# Patient Record
Sex: Female | Born: 1970 | Race: Black or African American | Hispanic: No | Marital: Married | Smoking: Never smoker
Health system: Southern US, Community
[De-identification: ages and names within clinical notes are randomized; demographics above are authoritative.]

## PROBLEM LIST (undated history)

## (undated) DIAGNOSIS — Z973 Presence of spectacles and contact lenses: Secondary | ICD-10-CM

## (undated) DIAGNOSIS — D259 Leiomyoma of uterus, unspecified: Secondary | ICD-10-CM

## (undated) DIAGNOSIS — D509 Iron deficiency anemia, unspecified: Secondary | ICD-10-CM

## (undated) DIAGNOSIS — N921 Excessive and frequent menstruation with irregular cycle: Secondary | ICD-10-CM

## (undated) DIAGNOSIS — R9389 Abnormal findings on diagnostic imaging of other specified body structures: Secondary | ICD-10-CM

---

## 1998-10-10 ENCOUNTER — Ambulatory Visit (HOSPITAL_COMMUNITY): Admission: RE | Admit: 1998-10-10 | Discharge: 1998-10-10 | Payer: Self-pay | Admitting: Obstetrics and Gynecology

## 1998-12-26 ENCOUNTER — Inpatient Hospital Stay (HOSPITAL_COMMUNITY): Admission: AD | Admit: 1998-12-26 | Discharge: 1998-12-26 | Payer: Self-pay | Admitting: Obstetrics and Gynecology

## 1998-12-28 ENCOUNTER — Inpatient Hospital Stay (HOSPITAL_COMMUNITY): Admission: AD | Admit: 1998-12-28 | Discharge: 1998-12-30 | Payer: Self-pay | Admitting: Obstetrics and Gynecology

## 2000-06-19 ENCOUNTER — Other Ambulatory Visit: Admission: RE | Admit: 2000-06-19 | Discharge: 2000-06-19 | Payer: Self-pay | Admitting: Obstetrics and Gynecology

## 2001-03-07 ENCOUNTER — Other Ambulatory Visit: Admission: RE | Admit: 2001-03-07 | Discharge: 2001-03-07 | Payer: Self-pay | Admitting: Obstetrics and Gynecology

## 2001-03-28 ENCOUNTER — Encounter: Payer: Self-pay | Admitting: Obstetrics and Gynecology

## 2001-03-28 ENCOUNTER — Ambulatory Visit (HOSPITAL_COMMUNITY): Admission: RE | Admit: 2001-03-28 | Discharge: 2001-03-28 | Payer: Self-pay | Admitting: Obstetrics and Gynecology

## 2001-08-13 HISTORY — PX: TUBAL LIGATION: SHX77

## 2001-08-15 ENCOUNTER — Inpatient Hospital Stay (HOSPITAL_COMMUNITY): Admission: AD | Admit: 2001-08-15 | Discharge: 2001-08-15 | Payer: Self-pay | Admitting: Obstetrics and Gynecology

## 2001-08-27 ENCOUNTER — Encounter (INDEPENDENT_AMBULATORY_CARE_PROVIDER_SITE_OTHER): Payer: Self-pay

## 2001-08-27 ENCOUNTER — Inpatient Hospital Stay (HOSPITAL_COMMUNITY): Admission: AD | Admit: 2001-08-27 | Discharge: 2001-08-30 | Payer: Self-pay | Admitting: Obstetrics and Gynecology

## 2001-08-29 HISTORY — PX: TUBAL LIGATION: SHX77

## 2008-04-26 ENCOUNTER — Other Ambulatory Visit: Admission: RE | Admit: 2008-04-26 | Discharge: 2008-04-26 | Payer: Self-pay | Admitting: Family Medicine

## 2009-06-30 ENCOUNTER — Other Ambulatory Visit: Admission: RE | Admit: 2009-06-30 | Discharge: 2009-06-30 | Payer: Self-pay | Admitting: Family Medicine

## 2010-12-05 ENCOUNTER — Other Ambulatory Visit: Payer: Self-pay

## 2010-12-05 ENCOUNTER — Other Ambulatory Visit (HOSPITAL_COMMUNITY)
Admission: RE | Admit: 2010-12-05 | Discharge: 2010-12-05 | Disposition: A | Discharge status: Home / Self Care | Payer: BC Managed Care – PPO | Source: Ambulatory Visit | Attending: Internal Medicine | Admitting: Internal Medicine

## 2010-12-05 DIAGNOSIS — Z01419 Encounter for gynecological examination (general) (routine) without abnormal findings: Secondary | ICD-10-CM | POA: Insufficient documentation
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
## 2010-12-29 NOTE — Op Note (Signed)
[facility]  Patient:    Regina Chung, Regina Chung Visit Number: [id number] MRN: [id number]          Service Type: OBS Location: 910A 9135 01 Attending Physician:  Michaele Offer Dictated by:   Malachi Pro. Ambrose Mantle, M.D. Proc. Date: 08/29/01 Admit Date:  08/27/2001                             Operative Report  PREOPERATIVE DIAGNOSIS:       Voluntary sterilization.  POSTOPERATIVE DIAGNOSIS:      Voluntary sterilization.  OPERATION:                    Bilateral tubal ligation.  SURGEON:                      Malachi Pro. Ambrose Mantle, M.D.  ANESTHESIA:                   Epidural.  DESCRIPTION OF PROCEDURE:     The patient was brought to the operating room and placed under satisfactory epidural anesthesia.  We had to wait a significant period of time for the anesthetic to become effective but it finally did.  I also used a little local anesthetic in the skin to try to anesthetize the skin.  A subumbilical semilunar incision was made and carried in layers through the skin and subcutaneous tissue.  I wanted to be very careful entering the peritoneal cavity because you could see bowel loops just under the incision.  I did enter the peritoneal cavity with great care, exposed both tubes, traced them to their fimbriated ends - I did not get a real good look at either ovary - elevated the tube into the operative field on both the right and left sides, cauterized a portion of the mesosalpinx, placed two ties proximally and distally on the right and left tubes, excised a portion of tube in-between the ties.  The right mesosalpinx was completely hemostatic.  The left, however, had a brisk bleeder.  I cauterized this and then I sutured the entire mesosalpinx with a running suture of 3-0 Vicryl.  It did not seem to tie down real well so I placed an interrupted figure-of-eight suture of 3-0 Vicryl through the mesosalpinx.  There did not seem to be any bleeding at all at  this point.  I had checked the removed segments of tube to confirm that they did contain the tube and they did.  At this point hemostasis was adequate.  I did inspect the lower part of the uterus and found a dense adhesion to the lower part of the uterus where I suspect the C section was done, but through my small semilunar incision I could not get to it to dissect away the adhesion, so it is just a matter of note that there is a dense adhesion over the anterior part of the lower uterine segment.  The fascia was then closed with a running suture of 0 Vicryl incorporating the peritoneum and the fascia and some of the subcutaneous tissue into the suture, and then the skin was closed with interrupted sutures of 3-0 plain catgut.  The patient seemed to tolerated the procedure well.  Blood loss was no more than 10 cc. Sponge and needle counts were correct and she was returned to the recovery in satisfactory condition. Dictated by:   Malachi Pro. Ambrose Mantle, M.D. Attending Physician:  Meisinger, Garlan Fair DD:  08/29/01 TD:  08/29/01 Job: [id number]
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
## 2013-02-09 ENCOUNTER — Encounter (HOSPITAL_COMMUNITY): Payer: Self-pay | Admitting: *Deleted

## 2013-02-09 ENCOUNTER — Emergency Department (HOSPITAL_COMMUNITY)
Admission: EM | Admit: 2013-02-09 | Discharge: 2013-02-09 | Disposition: A | Discharge status: Home / Self Care | Payer: BC Managed Care – PPO | Source: Home / Self Care | Attending: Emergency Medicine | Admitting: Emergency Medicine

## 2013-02-09 ENCOUNTER — Emergency Department (INDEPENDENT_AMBULATORY_CARE_PROVIDER_SITE_OTHER): Payer: BC Managed Care – PPO

## 2013-02-09 DIAGNOSIS — R109 Unspecified abdominal pain: Secondary | ICD-10-CM

## 2013-02-09 LAB — POCT URINALYSIS DIP (DEVICE)
Bilirubin Urine: NEGATIVE
Glucose, UA: NEGATIVE mg/dL
Hgb urine dipstick: NEGATIVE
Ketones, ur: NEGATIVE mg/dL
Leukocytes, UA: NEGATIVE
Nitrite: NEGATIVE
Protein, ur: NEGATIVE mg/dL
Specific Gravity, Urine: 1.015 (ref 1.005–1.030)
Urobilinogen, UA: 2 mg/dL — ABNORMAL HIGH (ref 0.0–1.0)
pH: 7 (ref 5.0–8.0)

## 2013-02-09 LAB — POCT I-STAT, CHEM 8
BUN: 7 mg/dL (ref 6–23)
Calcium, Ion: 1.2 mmol/L (ref 1.12–1.23)
Chloride: 102 mEq/L (ref 96–112)
Glucose, Bld: 72 mg/dL (ref 70–99)
HCT: 40 % (ref 36.0–46.0)
TCO2: 25 mmol/L (ref 0–100)

## 2013-02-09 MED ORDER — POLYETHYLENE GLYCOL 3350 17 GM/SCOOP PO POWD: 17.0000 g | Freq: Two times a day (BID) | ORAL | Status: DC

## 2013-02-09 MED ORDER — NAPROXEN 500 MG PO TABS: 500.0000 mg | ORAL_TABLET | Freq: Two times a day (BID) | ORAL | Status: DC

## 2013-02-09 MED ORDER — METHYLPREDNISOLONE 4 MG PO KIT: PACK | ORAL | Status: DC

## 2013-02-09 NOTE — ED Notes (Addendum)
C/o L flank pain onset 0900.  No burning or frequency of urination,  No hx. of kidney stones,  No chills or fever.  She tried to get from a lying position at 1500 and could not get up. Pain was 9/10 but is 4/10 now.  It is sharp and then dull.

## 2013-02-09 NOTE — ED Provider Notes (Signed)
Medical screening examination/treatment/procedure(s) were performed by non-physician practitioner and as supervising physician I was immediately available for consultation/collaboration.  Mishael Krysiak, M.D.  Aditya Nastasi C Latima Hamza, MD 02/09/13 2200 

## 2013-02-09 NOTE — ED Provider Notes (Signed)
History    CSN: [id number] Arrival date & time 02/09/13  1621  None    Chief Complaint  Patient presents with  . Flank Pain   (Consider location/radiation/quality/duration/timing/severity/associated sxs/prior Treatment) HPI Comments: 42 year old female presents complaining of left flank pain that began this morning around 9:00. The pain had a gradual onset and was grabbed initially as a dull, aching pain. Around knee and she went home and lay down for a nap, and when she tried to get up the pain was so severe that she could not sit up. It was very sharp and radiated around the flank towards the abdomen. This was associated with dizziness at the time but the dizziness has since resolved. Now, she is experiencing intermittent sharp, 9/10 pain and dull aching 4/10 pain. She describes the double pain as feeling like a large gas bubble. She denies any nausea, vomiting, hematuria, dysuria, chest pain, rash, fever, chills. She has never experienced anything like this before. She has not taken any over-the-counter or prescription medicines for the pain.  Patient is a 42 y.o. female presenting with flank pain.  Flank Pain Pertinent negatives include no chest pain, no abdominal pain and no shortness of breath.   History reviewed. No pertinent past medical history. Past Surgical History  Procedure Laterality Date  . Tubal ligation  2003   Family History  Problem Relation Age of Onset  . HIV Father    History  Substance Use Topics  . Smoking status: Never Smoker   . Smokeless tobacco: Not on file  . Alcohol Use: No   OB History   Grav Para Term Preterm Abortions TAB SAB Ect Mult Living                 Review of Systems  Constitutional: Negative for fever, chills and diaphoresis.  Eyes: Negative for visual disturbance.  Respiratory: Negative for cough and shortness of breath.   Cardiovascular: Negative for chest pain, palpitations and leg swelling.  Gastrointestinal: Negative for  nausea, vomiting, abdominal pain, diarrhea and constipation.  Endocrine: Negative for polydipsia and polyuria.  Genitourinary: Positive for flank pain. Negative for dysuria, urgency, frequency, hematuria, decreased urine volume, vaginal discharge and pelvic pain.  Musculoskeletal: Negative for myalgias and arthralgias.  Skin: Negative for rash.  Neurological: Positive for dizziness. Negative for weakness and light-headedness.    Allergies  Review of patient's allergies indicates no known allergies.  Home Medications   Current Outpatient Rx  Name  Route  Sig  Dispense  Refill  . methylPREDNISolone (MEDROL DOSEPAK) 4 MG tablet      Use taper dose pack as directed   21 tablet   0   . naproxen (NAPROSYN) 500 MG tablet   Oral   Take 1 tablet (500 mg total) by mouth 2 (two) times daily.   60 tablet   0   . polyethylene glycol powder (MIRALAX) powder   Oral   Take 17 g by mouth 2 (two) times daily.   255 g   0    BP 112/76  Pulse 64  Temp(Src) 99.7 F (37.6 C) (Oral)  Resp 18  SpO2 100%  LMP 01/17/2013 Physical Exam  Nursing note and vitals reviewed. Constitutional: She is oriented to person, place, and time. Vital signs are normal. She appears well-developed and well-nourished. No distress.  HENT:  Head: Atraumatic.  Eyes: EOM are normal. Pupils are equal, round, and reactive to light.  Cardiovascular: Normal rate, regular rhythm and normal heart sounds.  Exam  reveals no gallop and no friction rub.   No murmur heard. Pulmonary/Chest: Effort normal and breath sounds normal. No respiratory distress. She has no wheezes. She has no rales.  Abdominal: Soft. There is no tenderness. There is CVA tenderness (left sided).  Neurological: She is alert and oriented to person, place, and time. She has normal strength.  Skin: Skin is warm and dry. She is not diaphoretic.  Psychiatric: She has a normal mood and affect. Her behavior is normal. Judgment normal.    ED Course   Procedures (including critical care time) Labs Reviewed  POCT URINALYSIS DIP (DEVICE) - Abnormal; Notable for the following:    Urobilinogen, UA 2.0 (*)    All other components within normal limits  URINE CULTURE  POCT I-STAT, CHEM 8   Dg Abd 1 View  02/09/2013   *RADIOLOGY REPORT*  Clinical Data: Left flank pain.  ABDOMEN - 1 VIEW  Comparison: None.  Findings: No plain film evidence of urinary tract stone is identified.  Moderately large stool burden is noted.  Bowel gas pattern is otherwise unremarkable.  No focal bony abnormality.  IMPRESSION: Moderately large stool burden.  Otherwise negative.   Original Report Authenticated By: Holley Dexter, M.D.   1. Flank pain     MDM  This patient's history sounds like this could be nephrolithiasis. However there is no blood in the urine and no radiographic evidence of kidney stone. We'll treat her with anti-inflammatories, corticosteroids, and have her strain her urine to check for passing of a kidney stone. Also starting on twice a day MiraLax given in the remarks made by the radiologist about the moderately large stool burden. If she is still having this pain in 2-3 days she will return to the emergency department for further workup.   Meds ordered this encounter  Medications  . polyethylene glycol powder (MIRALAX) powder    Sig: Take 17 g by mouth 2 (two) times daily.    Dispense:  255 g    Refill:  0  . naproxen (NAPROSYN) 500 MG tablet    Sig: Take 1 tablet (500 mg total) by mouth 2 (two) times daily.    Dispense:  60 tablet    Refill:  0  . methylPREDNISolone (MEDROL DOSEPAK) 4 MG tablet    Sig: Use taper dose pack as directed    Dispense:  21 tablet    Refill:  0     Graylon Good, PA-C 02/09/13 2128

## 2013-02-12 LAB — URINE CULTURE

## 2013-03-27 ENCOUNTER — Other Ambulatory Visit: Payer: Self-pay | Admitting: Family Medicine

## 2013-03-27 ENCOUNTER — Other Ambulatory Visit (HOSPITAL_COMMUNITY)
Admission: RE | Admit: 2013-03-27 | Discharge: 2013-03-27 | Disposition: A | Discharge status: Home / Self Care | Payer: BC Managed Care – PPO | Source: Ambulatory Visit | Attending: Family Medicine | Admitting: Family Medicine

## 2013-03-27 DIAGNOSIS — Z0000 Encounter for general adult medical examination without abnormal findings: Secondary | ICD-10-CM | POA: Insufficient documentation

## 2014-04-05 ENCOUNTER — Other Ambulatory Visit: Payer: Self-pay | Admitting: Family Medicine

## 2014-04-05 ENCOUNTER — Other Ambulatory Visit: Payer: Self-pay

## 2014-04-05 DIAGNOSIS — Z1231 Encounter for screening mammogram for malignant neoplasm of breast: Secondary | ICD-10-CM

## 2014-11-02 ENCOUNTER — Ambulatory Visit
Admission: RE | Admit: 2014-11-02 | Discharge: 2014-11-02 | Disposition: A | Discharge status: Home / Self Care | Payer: 59 | Source: Ambulatory Visit | Attending: Family Medicine | Admitting: Family Medicine

## 2014-11-02 DIAGNOSIS — Z1231 Encounter for screening mammogram for malignant neoplasm of breast: Secondary | ICD-10-CM

## 2014-11-04 ENCOUNTER — Other Ambulatory Visit: Payer: Self-pay | Admitting: Family Medicine

## 2014-11-04 DIAGNOSIS — R928 Other abnormal and inconclusive findings on diagnostic imaging of breast: Secondary | ICD-10-CM

## 2014-11-11 ENCOUNTER — Other Ambulatory Visit: Payer: 59

## 2014-11-12 ENCOUNTER — Ambulatory Visit
Admission: RE | Admit: 2014-11-12 | Discharge: 2014-11-12 | Disposition: A | Discharge status: Home / Self Care | Payer: 59 | Source: Ambulatory Visit | Attending: Family Medicine | Admitting: Family Medicine

## 2014-11-12 DIAGNOSIS — R928 Other abnormal and inconclusive findings on diagnostic imaging of breast: Secondary | ICD-10-CM

## 2016-01-20 ENCOUNTER — Other Ambulatory Visit (HOSPITAL_COMMUNITY)
Admission: RE | Admit: 2016-01-20 | Discharge: 2016-01-20 | Disposition: A | Discharge status: Home / Self Care | Payer: Self-pay | Source: Ambulatory Visit | Attending: Family Medicine | Admitting: Family Medicine

## 2016-01-20 ENCOUNTER — Other Ambulatory Visit: Payer: Self-pay | Admitting: Physician Assistant

## 2016-01-20 DIAGNOSIS — Z124 Encounter for screening for malignant neoplasm of cervix: Secondary | ICD-10-CM | POA: Insufficient documentation

## 2016-01-25 LAB — CYTOLOGY - PAP

## 2016-10-26 ENCOUNTER — Other Ambulatory Visit: Payer: Self-pay | Admitting: Plastic Surgery

## 2017-01-23 ENCOUNTER — Other Ambulatory Visit: Payer: Self-pay | Admitting: Physician Assistant

## 2017-01-23 ENCOUNTER — Other Ambulatory Visit (HOSPITAL_COMMUNITY)
Admission: RE | Admit: 2017-01-23 | Discharge: 2017-01-23 | Disposition: A | Discharge status: Home / Self Care | Payer: BLUE CROSS/BLUE SHIELD | Source: Ambulatory Visit | Attending: Family Medicine | Admitting: Family Medicine

## 2017-01-23 DIAGNOSIS — Z124 Encounter for screening for malignant neoplasm of cervix: Secondary | ICD-10-CM | POA: Insufficient documentation

## 2017-01-28 LAB — CYTOLOGY - PAP: DIAGNOSIS: NEGATIVE

## 2018-08-13 HISTORY — PX: REDUCTION MAMMAPLASTY: SUR839

## 2019-05-26 ENCOUNTER — Other Ambulatory Visit: Payer: Self-pay | Admitting: Physician Assistant

## 2019-05-26 DIAGNOSIS — Z1231 Encounter for screening mammogram for malignant neoplasm of breast: Secondary | ICD-10-CM

## 2019-06-05 ENCOUNTER — Other Ambulatory Visit: Payer: Self-pay

## 2019-06-05 ENCOUNTER — Ambulatory Visit
Admission: RE | Admit: 2019-06-05 | Discharge: 2019-06-05 | Disposition: A | Discharge status: Home / Self Care | Payer: BLUE CROSS/BLUE SHIELD | Source: Ambulatory Visit | Attending: Physician Assistant | Admitting: Physician Assistant

## 2019-06-05 DIAGNOSIS — Z1231 Encounter for screening mammogram for malignant neoplasm of breast: Secondary | ICD-10-CM

## 2019-07-08 ENCOUNTER — Ambulatory Visit: Payer: BLUE CROSS/BLUE SHIELD

## 2019-07-20 HISTORY — PX: REDUCTION MAMMAPLASTY: SUR839

## 2020-04-06 ENCOUNTER — Ambulatory Visit
Admission: EM | Admit: 2020-04-06 | Discharge: 2020-04-06 | Disposition: A | Discharge status: Home / Self Care | Payer: 59 | Attending: Physician Assistant | Admitting: Physician Assistant

## 2020-04-06 ENCOUNTER — Other Ambulatory Visit: Payer: Self-pay

## 2020-04-06 DIAGNOSIS — H00015 Hordeolum externum left lower eyelid: Secondary | ICD-10-CM

## 2020-04-06 MED ORDER — ERYTHROMYCIN 5 MG/GM OP OINT: TOPICAL_OINTMENT | OPHTHALMIC | 0 refills | Status: DC

## 2020-04-06 NOTE — ED Provider Notes (Signed)
[facility]    CSN: [id number] Arrival date & time: 04/06/20  1656      History   Chief Complaint Chief Complaint  Patient presents with  . Eye Problem    HPI Regina Chung is a 49 y.o. female.   49 year old female comes in for 3 day history of left eyelid swelling. Left lower eyelid swelling that has gotten worse. Pain to the nasal aspect with some redness. Denies drainage/crusting, vision changes, photophobia. Denies URI symptoms, fever. Glasses use without contact lens use. Did have eyelash extension to bilateral upper eyelash, removed yesterday without improvement of symptoms. Warm compress without improvement of symptoms.      History reviewed. No pertinent past medical history.  There are no problems to display for this patient.   Past Surgical History:  Procedure Laterality Date  . TUBAL LIGATION  2003    OB History   No obstetric history on file.      Home Medications    Prior to Admission medications   Medication Sig Start Date End Date Taking? Authorizing Provider  erythromycin ophthalmic ointment Place a 1/2 inch ribbon of ointment 4 times a day to left lower eyelid x 7 days 04/06/20   Belinda Fisher, PA-C  naproxen (NAPROSYN) 500 MG tablet Take 1 tablet (500 mg total) by mouth 2 (two) times daily. 02/09/13   Graylon Good, PA-C    Family History Family History  Problem Relation Age of Onset  . HIV Father     Social History Social History   Tobacco Use  . Smoking status: Never Smoker  . Smokeless tobacco: Never Used  Vaping Use  . Vaping Use: Never used  Substance Use Topics  . Alcohol use: No  . Drug use: No     Allergies   Patient has no known allergies.   Review of Systems Review of Systems  Reason unable to perform ROS: See HPI as above.     Physical Exam Triage Vital Signs ED Triage Vitals  Enc Vitals Group     BP 04/06/20 1838 131/89     Pulse Rate 04/06/20 1838 60     Resp 04/06/20 1838 18     Temp  04/06/20 1838 98.5 F (36.9 C)     Temp Source 04/06/20 1838 Oral     SpO2 04/06/20 1838 100 %     Weight --      Height --      Head Circumference --      Peak Flow --      Pain Score 04/06/20 1836 2     Pain Loc --      Pain Edu? --      Excl. in GC? --    No data found.  Updated Vital Signs BP 131/89 (BP Location: Left Arm)   Pulse 60   Temp 98.5 F (36.9 C) (Oral)   Resp 18   LMP 04/06/2020   SpO2 100%   Physical Exam Constitutional:      General: She is not in acute distress.    Appearance: She is well-developed. She is not ill-appearing, toxic-appearing or diaphoretic.  HENT:     Head: Normocephalic and atraumatic.  Eyes:     Pupils: Pupils are equal, round, and reactive to light.     Comments: Left eye hordeolum to the nasal aspect of lower eyelid. Left lower eyelid swelling with mild erythema. No warmth. Conjunctiva clear. EOMI.   Neurological:  Mental Status: She is alert and oriented to person, place, and time.      UC Treatments / Results  Labs (all labs ordered are listed, but only abnormal results are displayed) Labs Reviewed - No data to display  EKG   Radiology No results found.  Procedures Procedures (including critical care time)  Medications Ordered in UC Medications - No data to display  Initial Impression / Assessment and Plan / UC Course  I have reviewed the triage vital signs and the nursing notes.  Pertinent labs & imaging results that were available during my care of the patient were reviewed by me and considered in my medical decision making (see chart for details).    Erythromycin ointment as directed. Lid scrubs and warm compresses as directed. Patient to follow up with ophthalmology if symptoms worsens or does not improve. Return precautions given.   Final Clinical Impressions(s) / UC Diagnoses   Final diagnoses:  Hordeolum externum of left lower eyelid   ED Prescriptions    Medication Sig Dispense Auth. Provider    erythromycin ophthalmic ointment Place a 1/2 inch ribbon of ointment 4 times a day to left lower eyelid x 7 days 1 g Belinda Fisher, PA-C     PDMP not reviewed this encounter.   Belinda Fisher, PA-C 04/06/20 1907

## 2020-04-06 NOTE — Discharge Instructions (Signed)
Erythromycin ointment as directed. Lid scrubs and warm compresses as directed. Monitor for any worsening of symptoms, changes in vision, sensitivity to light, eye swelling, painful eye movement, follow up with ophthalmology for further evaluation.   

## 2020-04-06 NOTE — ED Triage Notes (Signed)
Pt c/o left eye swelling since Sunday with increased swelling the past few days. Denies drainage/exudate from eye. Has been applying warm compresses and washed with OTC "pink eye wash". Denies fever, chills, congestion, runny nose or other c/o.

## 2021-06-08 ENCOUNTER — Other Ambulatory Visit: Payer: Self-pay | Admitting: Physician Assistant

## 2021-06-08 DIAGNOSIS — Z1231 Encounter for screening mammogram for malignant neoplasm of breast: Secondary | ICD-10-CM

## 2021-06-13 ENCOUNTER — Ambulatory Visit
Admission: RE | Admit: 2021-06-13 | Discharge: 2021-06-13 | Disposition: A | Discharge status: Home / Self Care | Payer: 59 | Source: Ambulatory Visit | Attending: Physician Assistant | Admitting: Physician Assistant

## 2021-06-13 ENCOUNTER — Other Ambulatory Visit: Payer: Self-pay

## 2021-06-13 DIAGNOSIS — Z1231 Encounter for screening mammogram for malignant neoplasm of breast: Secondary | ICD-10-CM

## 2021-12-11 HISTORY — PX: COLONOSCOPY: SHX174

## 2022-07-26 ENCOUNTER — Other Ambulatory Visit: Payer: Self-pay | Admitting: Physician Assistant

## 2022-07-26 DIAGNOSIS — Z1231 Encounter for screening mammogram for malignant neoplasm of breast: Secondary | ICD-10-CM

## 2022-09-17 ENCOUNTER — Ambulatory Visit
Admission: RE | Admit: 2022-09-17 | Discharge: 2022-09-17 | Disposition: A | Discharge status: Home / Self Care | Payer: Managed Care, Other (non HMO) | Source: Ambulatory Visit | Attending: Physician Assistant | Admitting: Physician Assistant

## 2022-09-17 ENCOUNTER — Ambulatory Visit: Payer: 59

## 2022-09-17 DIAGNOSIS — Z1231 Encounter for screening mammogram for malignant neoplasm of breast: Secondary | ICD-10-CM

## 2022-09-19 ENCOUNTER — Other Ambulatory Visit: Payer: Self-pay | Admitting: Obstetrics and Gynecology

## 2022-11-18 IMAGING — MG MM DIGITAL SCREENING BILAT W/ TOMO AND CAD
8 series · 8 of 24 positions shown · non-contrast
Comparison: Previous exam(s).

CLINICAL DATA: Screening.

EXAM:
DIGITAL SCREENING BILATERAL MAMMOGRAM WITH TOMOSYNTHESIS AND CAD
TECHNIQUE: Bilateral screening digital craniocaudal and mediolateral oblique
mammograms were obtained. Bilateral screening digital breast
tomosynthesis was performed. The images were evaluated with
computer-aided detection.

[R MLO synth-2D]
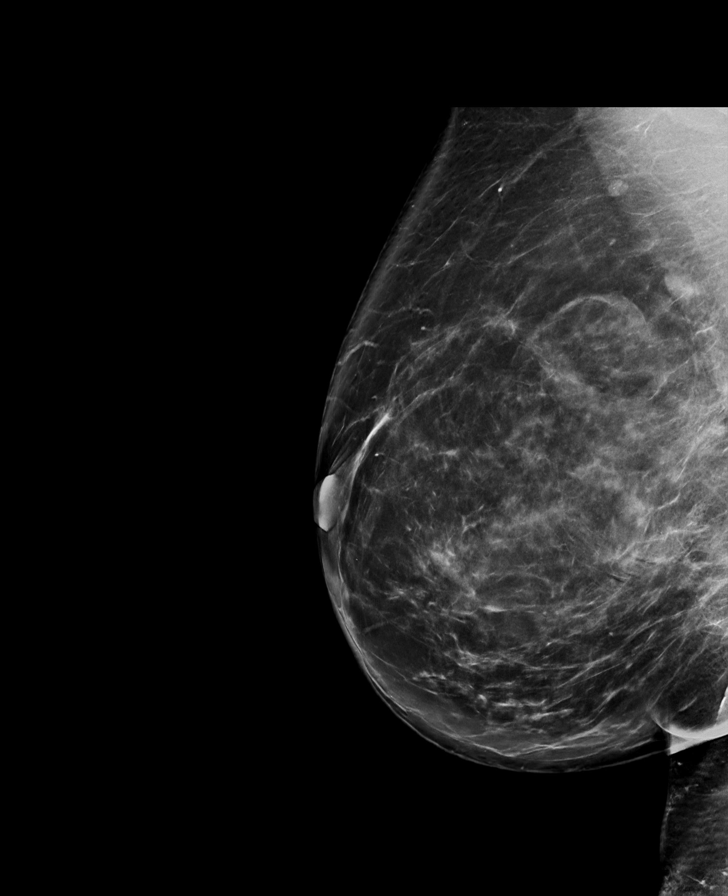

[L CC synth-2D]
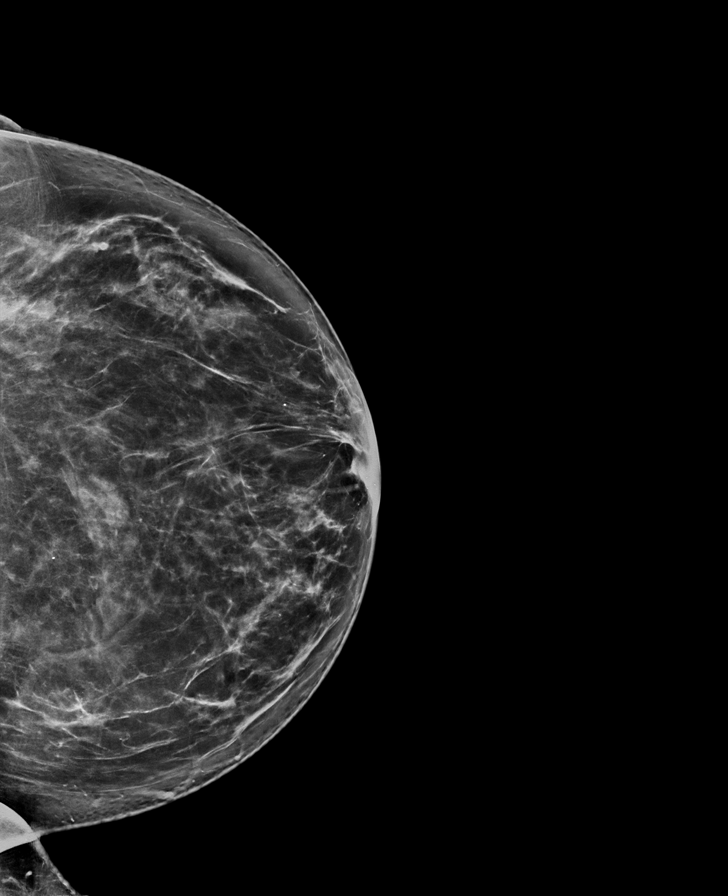

[R CC synth-2D]
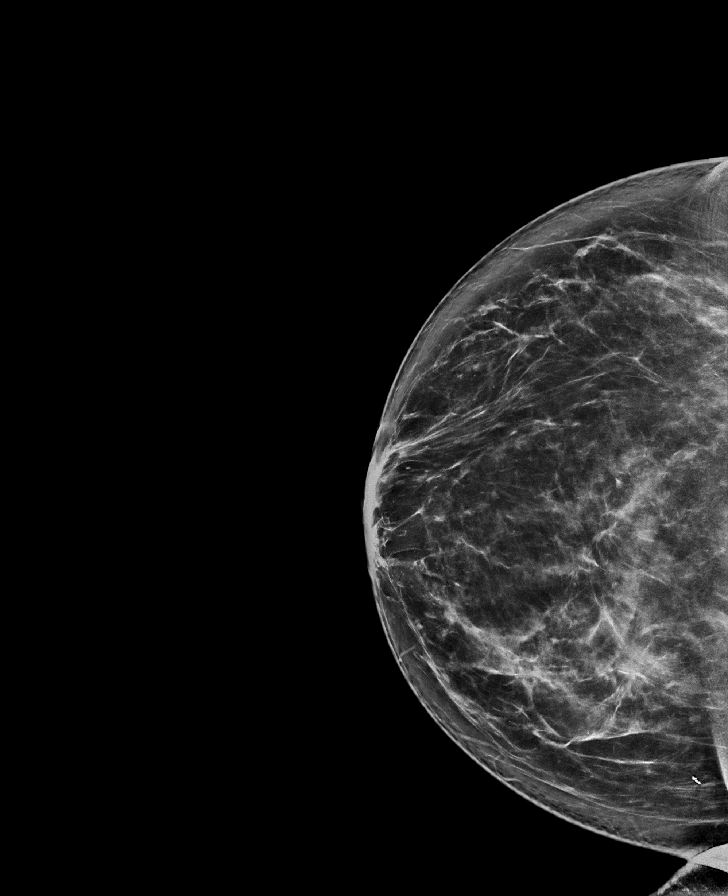

[L MLO synth-2D]
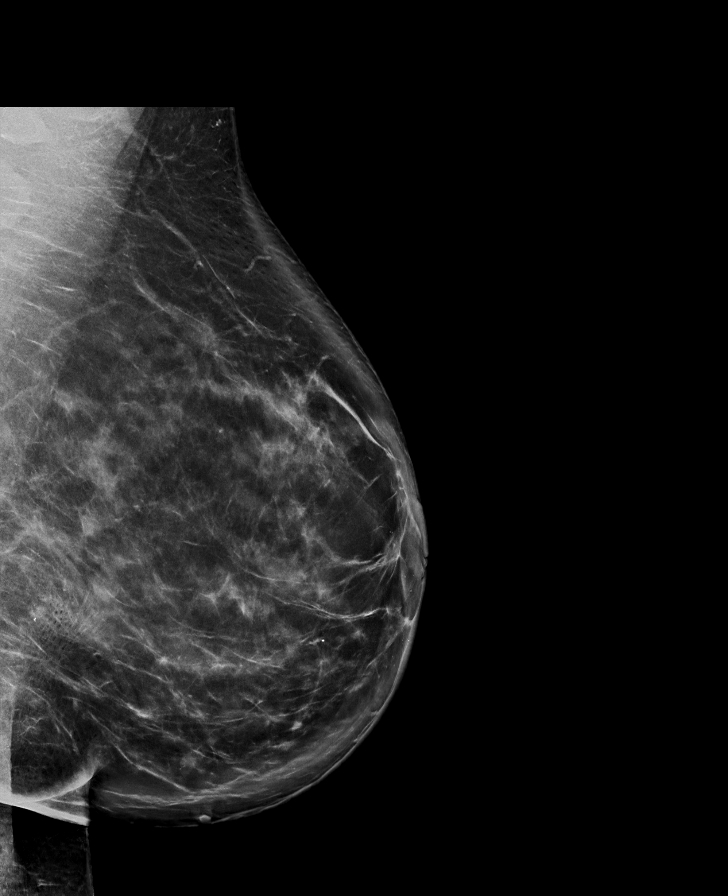

[L MLO tomo · tomo slice 49/98.0]
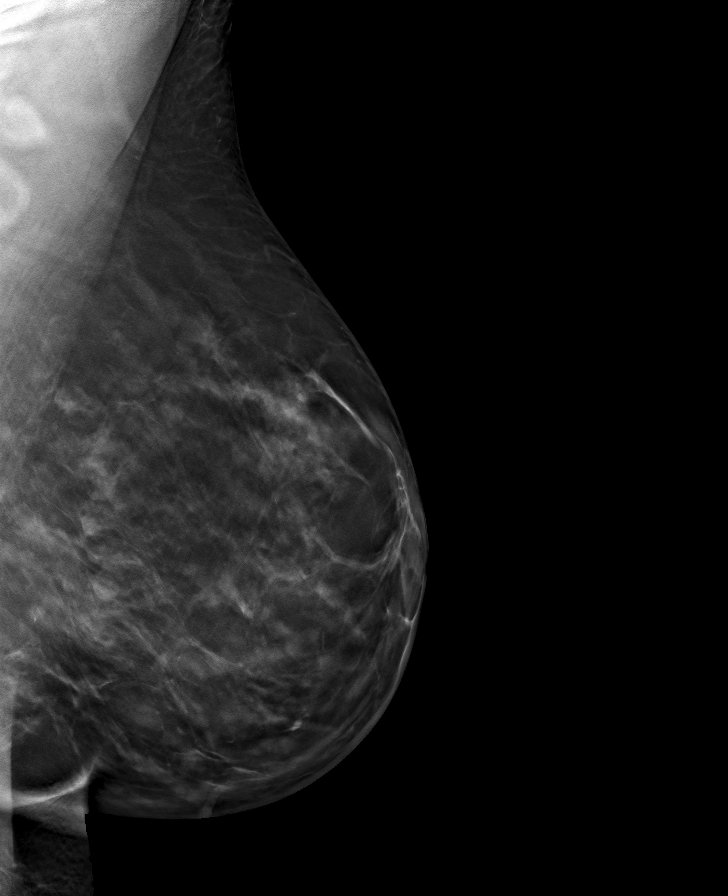

[L CC tomo · tomo slice 45/90.0]
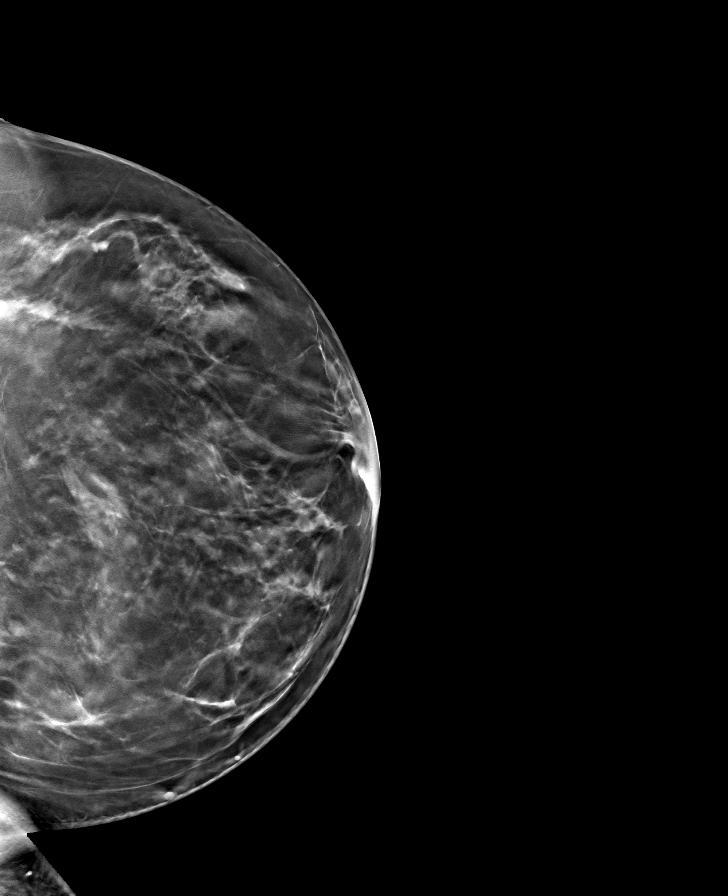

[R CC tomo · tomo slice 45/89.0]
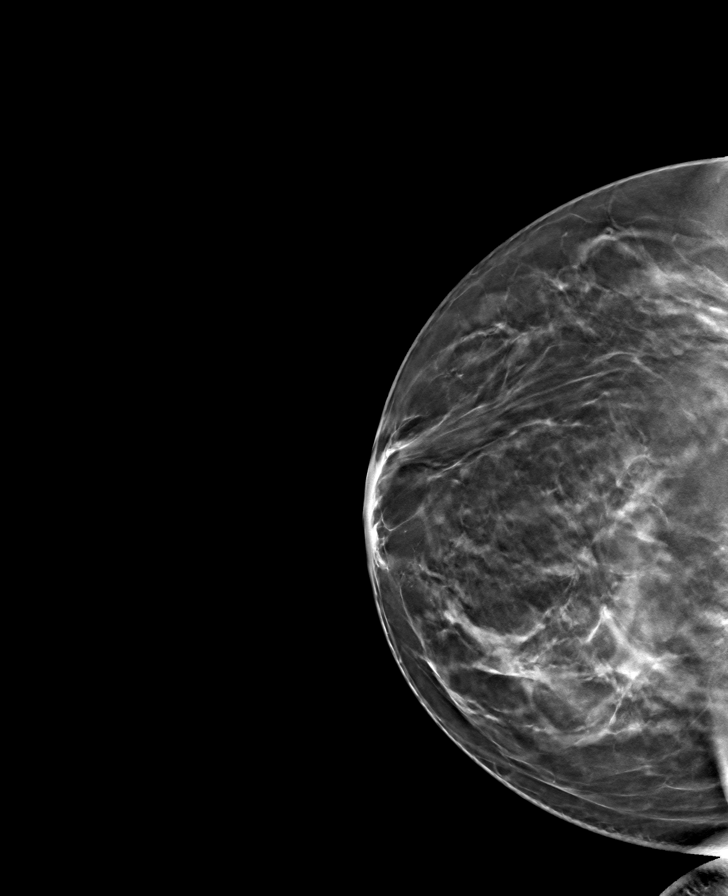

[R MLO tomo · tomo slice 51/101.0]
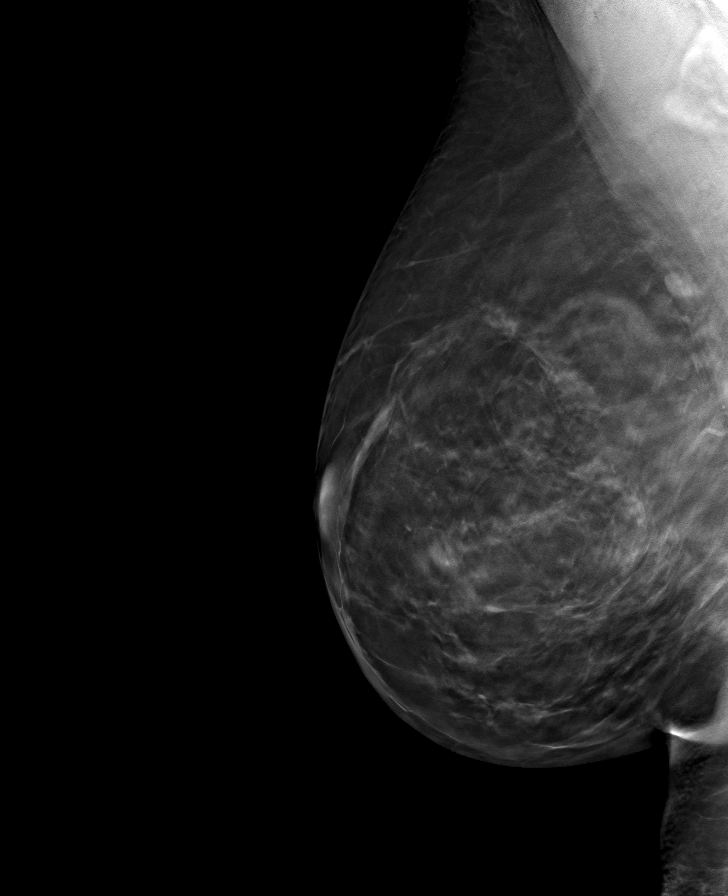

[8 of 24 positions shown; findings below may reference images not displayed]

ACR Breast Density Category c: The breast tissue is heterogeneously
dense, which may obscure small masses.
FINDINGS: There are no findings suspicious for malignancy.
IMPRESSION: No mammographic evidence of malignancy. A result letter of this
screening mammogram will be mailed directly to the patient.

RECOMMENDATION:
Screening mammogram in one year. (Code:Q3-W-BC3)

BI-RADS CATEGORY  1: Negative.

## 2022-11-22 ENCOUNTER — Encounter (HOSPITAL_BASED_OUTPATIENT_CLINIC_OR_DEPARTMENT_OTHER): Payer: Self-pay | Admitting: Obstetrics and Gynecology

## 2022-11-22 NOTE — Progress Notes (Signed)
Spoke w/ via phone for pre-op interview--- pt Lab needs dos----  urine preg            C Lab results------ pt has lab appt 11-23-2022 @ 0830 getting CB/ T&S COVID test -----patient states asymptomatic no test needed Arrive at ------- 0530 on 11-27-2022 NPO after MN NO Solid Food.  Clear liquids from MN until--- 0430 Med rec completed Medications to take morning of surgery ----- iron Diabetic medication ----- n/a Patient instructed no nail polish to be worn day of surgery Patient instructed to bring photo id and insurance card day of surgery Patient aware to have Driver (ride ) / caregiver    for 24 hours after surgery -- wife, gregory Patient Special Instructions ----- pt will pick up bag w/ hibiclens soap and instructions at lab appt Pre-Op special Istructions -----  n/a Patient verbalized understanding of instructions that were given at this phone interview. Patient denies shortness of breath, chest pain, fever, cough at this phone interview.

## 2022-11-22 NOTE — Progress Notes (Signed)
Your procedure is scheduled on :  Tuesday,  11-27-2022  Report to [facility] AT  _5:30__ AM.   Call this number if you have problems the morning of surgery  :[phone].   OUR ADDRESS IS [address].  [facility] ARE LOCATED IN [facility].  PLEASE BRING YOUR INSURANCE CARD AND PHOTO ID DAY OF SURGERY.  ONLY 2 PEOPLE ARE ALLOWED IN  WAITING  ROOM.                                     REMEMBER:  DO NOT EAT FOOD AFTER MIDNIGHT THE NIGHT BEFORE YOUR SURGERY . YOU MAY HAVE CLEAR LIQUIDS FROM MIDNIGHT THE NIGHT BEFORE YOUR SURGERY UNTIL  _4:30 AM. NO CLEAR LIQUIDS AFTER   4:30 AM_ DAY OF SURGERY INCLUDING NO CANDY/ GUM/ MINTS.  ONLY SIPS OF WATER WITH SIPS OF WATER WITH MEDICATION.  YOU MAY  BRUSH YOUR TEETH MORNING OF SURGERY AND RINSE YOUR MOUTH OUT.  CLEAR LIQUID DIET  Allowed      Water                                                                   Black Coffee and tea, regular and decaf  (NO cream or milk products of any type, may sweeten)                         Carbonated beverages, regular and diet                                    Sports drinks like Gatorade _____________________________________________________________________     TAKE ONLY THESE MEDICATIONS MORNING OF SURGERY:  Iron    UP TO 4 VISITORS  MAY VISIT IN THE EXTENDED RECOVERY ROOM UNTIL 800 PM ONLY.  ONE  VISITOR AGE 63 AND OVER MAY SPEND THE NIGHT AND MUST BE IN EXTENDED RECOVERY ROOM NO LATER THAN 800 PM . YOUR DISCHARGE TIME AFTER YOU SPEND THE NIGHT IS 900 AM THE MORNING AFTER YOUR SURGERY.  YOU MAY PACK A SMALL OVERNIGHT BAG WITH TOILETRIES FOR YOUR OVERNIGHT STAY IF YOU WISH.  YOUR PRESCRIPTION MEDICATIONS WILL BE PROVIDED DURING [facility] STAY.                                      DO NOT WEAR JEWERLY/  METAL/  PIERCINGS (INCLUDING NO PLASTIC PIERCINGS) DO NOT WEAR LOTIONS, POWDERS, PERFUMES OR NAIL POLISH ON YOUR FINGERNAILS. TOENAIL POLISH IS OK TO  WEAR. DO NOT SHAVE FOR 48 HOURS PRIOR TO DAY OF SURGERY.  CONTACTS, GLASSES, OR DENTURES MAY NOT BE WORN TO SURGERY.  REMEMBER: NO SMOKING, VAPING ,  DRUGS OR ALCOHOL FOR 24 HOURS BEFORE YOUR SURGERY.                                    [facility] IS NOT  RESPONSIBLE  FOR ANY BELONGINGS.                                                                    Marland Kitchen           [facility] - Preparing for Surgery Before surgery, you can play an important role.  Because skin is not sterile, your skin needs to be as free of germs as possible.  You can reduce the number of germs on your skin by washing with CHG (chlorahexidine gluconate) soap before surgery.  CHG is an antiseptic cleaner which kills germs and bonds with the skin to continue killing germs even after washing. Please DO NOT use if you have an allergy to CHG or antibacterial soaps.  If your skin becomes reddened/irritated stop using the CHG and inform your nurse when you arrive at Short Stay. Do not shave (including legs and underarms) for at least 48 hours prior to the first CHG shower.  You may shave your face/neck. Please follow these instructions carefully:  1.  Shower with CHG Soap the night before surgery and the  morning of Surgery.  2.  If you choose to wash your hair, wash your hair first as usual with your  normal  shampoo.  3.  After you shampoo, rinse your hair and body thoroughly to remove the  shampoo.                                        4.  Use CHG as you would any other liquid soap.  You can apply chg directly  to the skin and wash , chg soap provided, night before and morning of your surgery.  5.  Apply the CHG Soap to your body ONLY FROM THE NECK DOWN.   Do not use on face/ open                           Wound or open sores. Avoid contact with eyes, ears mouth and genitals (private parts).                       Wash face,  Genitals (private parts) with your normal soap.             6.  Wash thoroughly, paying special attention  to the area where your surgery  will be performed.  7.  Thoroughly rinse your body with warm water from the neck down.  8.  DO NOT shower/wash with your normal soap after using and rinsing off  the CHG Soap.             9.  Pat yourself dry with a clean towel.            10.  Wear clean pajamas.            11.  Place clean sheets on your bed the night of your first shower and do not  sleep with pets. Day of Surgery : Do not apply any lotions/ powders the morning of surgery.  Please wear clean clothes to the hospital/surgery center.  IF YOU  HAVE ANY SKIN IRRITATION OR PROBLEMS WITH THE SURGICAL SOAP, PLEASE GET A BAR OF GOLD DIAL SOAP AND SHOWER THE NIGHT BEFORE YOUR SURGERY AND THE MORNING OF YOUR SURGERY. PLEASE LET THE NURSE KNOW MORNING OF YOUR SURGERY IF YOU HAD ANY PROBLEMS WITH THE SURGICAL SOAP.   ________________________________________________________________________                                                        QUESTIONS Mechele ClaudeALL KELLY PRE OP NURSE PHONE [phone].   OR Juandavid Dallman PHONE [phone]

## 2022-11-23 ENCOUNTER — Encounter (HOSPITAL_COMMUNITY)
Admission: RE | Admit: 2022-11-23 | Discharge: 2022-11-23 | Disposition: A | Discharge status: Home / Self Care | Payer: Managed Care, Other (non HMO) | Source: Ambulatory Visit | Attending: Obstetrics and Gynecology | Admitting: Obstetrics and Gynecology

## 2022-11-23 DIAGNOSIS — Z01812 Encounter for preprocedural laboratory examination: Secondary | ICD-10-CM | POA: Diagnosis present

## 2022-11-23 DIAGNOSIS — Z01818 Encounter for other preprocedural examination: Secondary | ICD-10-CM

## 2022-11-23 LAB — CBC
HCT: 34.9 % — ABNORMAL LOW (ref 36.0–46.0)
Hemoglobin: 11.3 g/dL — ABNORMAL LOW (ref 12.0–15.0)
MCH: 29.9 pg (ref 26.0–34.0)
MCHC: 32.4 g/dL (ref 30.0–36.0)
MCV: 92.3 fL (ref 80.0–100.0)
Platelets: 251 10*3/uL (ref 150–400)
RBC: 3.78 MIL/uL — ABNORMAL LOW (ref 3.87–5.11)
RDW: 13.7 % (ref 11.5–15.5)
WBC: 7 10*3/uL (ref 4.0–10.5)
nRBC: 0 % (ref 0.0–0.2)

## 2022-11-23 LAB — TYPE AND SCREEN
ABO/RH(D): O POS
Antibody Screen: NEGATIVE

## 2022-11-26 ENCOUNTER — Other Ambulatory Visit: Payer: Self-pay | Admitting: Obstetrics and Gynecology

## 2022-11-26 NOTE — H&P (Deleted)
  The note originally documented on this encounter has been moved the the encounter in which it belongs.  

## 2022-11-26 NOTE — Anesthesia Preprocedure Evaluation (Signed)
Anesthesia Evaluation  Patient identified by MRN, date of birth, ID band Patient awake    Reviewed: Allergy & Precautions, NPO status , Patient's Chart, lab work & pertinent test results  History of Anesthesia Complications Negative for: history of anesthetic complications  Airway Mallampati: II  TM Distance: >3 FB Neck ROM: Full    Dental no notable dental hx. (+) Dental Advisory Given   Pulmonary neg pulmonary ROS   Pulmonary exam normal        Cardiovascular negative cardio ROS Normal cardiovascular exam     Neuro/Psych negative neurological ROS     GI/Hepatic negative GI ROS, Neg liver ROS,,,  Endo/Other  negative endocrine ROS    Renal/GU negative Renal ROS     Musculoskeletal negative musculoskeletal ROS (+)    Abdominal   Peds  Hematology  (+) Blood dyscrasia, anemia   Anesthesia Other Findings   Reproductive/Obstetrics                             Anesthesia Physical Anesthesia Plan  ASA: 2  Anesthesia Plan: General   Post-op Pain Management: Toradol IV (intra-op)* and Tylenol PO (pre-op)*   Induction: Intravenous  PONV Risk Score and Plan: 4 or greater and Ondansetron, Dexamethasone, Midazolam and Scopolamine patch - Pre-op  Airway Management Planned: Oral ETT  Additional Equipment:   Intra-op Plan:   Post-operative Plan: Extubation in OR  Informed Consent: I have reviewed the patients History and Physical, chart, labs and discussed the procedure including the risks, benefits and alternatives for the proposed anesthesia with the patient or authorized representative who has indicated his/her understanding and acceptance.     Dental advisory given  Plan Discussed with: Anesthesiologist and CRNA  Anesthesia Plan Comments:        Anesthesia Quick Evaluation

## 2022-11-26 NOTE — H&P (Signed)
eason for Appointment 1. Preop History of Present Illness General:         52 y/o presents for preop visit. Pt is scedule for a  robotic assisted laparoscopic hysterectomy with bilateral salpingectomy on 11/27/2022 for the management of menorrhagia and thickended endometrium.  IN REVIEW: She reported heavy menses, fatigue, anemia. she had a syncopal episode due to anemia. she is taking iron supplement. the started 6 months ago.Marland Kitchen Her last menses was 08/16/2022 and stopped 09/02/2022. on her heaviest day she changes a thick pad every 2 hours. she reports hygiene accidents. she has to sleep with a towel under her to keep from messing up the sheets. --- Pt had hgb of 10.4 and given ferrous sulfate supplement. Pt statest that she has not been taking.  GYN ULTRASOUND FINDINGS on 08/23/2022: UTerus 12.55x8.14x9.65cm uterine fibroids 1) Rt Post 6.4x6.3x5.5cm 2) Post lt 4.9x4.7x3.9cm 3) Ant 3.4x3.3x3.3cm Endometrium thickened. Pt on cycle currently Rt Ov wnrl/ Lt Ov not visualized. No adnexal masses seen. Visit on 08/01/2022, Pt complained of having heavy menses with large clots that are occurring twice a month; sometimes as often as 18 days apart and lasting 7 days. States that this change in menses has been happening x past year. Complaints of heavy flow of soaking pad every 1-2 hours and having increased cramping. Having other complaints of abdominal pain, bloating, change in bowel movements to increased constipation and fatigue. Denies vaginal discharge, pelvic pain, vasomotor symptoms or urinary symptoms. EMB on 09/19/2022 was benign.  Current Medications Taking Iron Medication List reviewed and reconciled with the patient Past Medical History      Overactive bladder (OAB).      Stress incontinence.      Iron deficiency anema. Surgical History       tubal ligation 2003       breast reduction ([place]) 2020       C-section x1       Colonoscopy (due 5 yrs out) 12/2021 Family  History Father: deceased 56 yrs, HIV Infection- drug user, Liver disease possible Mother: alive 31 yrs, diagnosed with Hypertension Brother 1: alive 1 yrs, Downs Syndrome Sister 1: alive 51 yrs, diagnosed with Hypertension 1 brother(s) , 1 sister(s) - healthy. 2 son(s) , 2 daughter(s) - healthy. No known direct family hx of colon cx, colon polyps or liver disease per pt. Liver disease possible in Father. Social History General:  Tobacco use  cigarettes:  Never smoked, Tobacco history last updated  11/13/2022, Vaping  No. EXPOSURE TO PASSIVE SMOKE: no. Alcohol: yes, 2 per week. Caffeine: yes, 1-2 per day. Recreational drug use: no. DIET: regular. Exercise: nothing structured. DENTAL CARE: good. Marital Status: married. Children: Boys, 2, girls, 2. EDUCATION: yes, [employer]. OCCUPATION: realtor. COMMUNICATION BARRIERS: none. 2023: now has 3 grandchildren (9, 5 and 3 yrs of age). Gyn History Sexual activity currently sexually active.  Periods : irregular.  LMP 10/18/2022.  Birth control BTL.  Last pap smear date 05/2020 - Negative.  Last mammogram date 06/2021 - negative.  Denies H/O Abnormal pap smear.  Denies H/O STD.  Menarche 14.  OB History Number of pregnancies  6.  miscarriages  1.  abortion  1.  Pregnancy # 1  abortion.  Pregnancy # 2  live birth, vaginal delivery.  Pregnancy # 3  live birth, vaginal delivery.  Pregnancy # 4:  live birth, vaginal delivery.  Pregnancy # 5:  Live Birth, C-section.  Allergies N.K.D.A. Hospitalization/Major Diagnostic Procedure Childbirth x 4 Review of Systems CONSTITUTIONAL:  Chills No.  Fatigue No.  Fever No.  Night sweats No.  Recent travel outside [place] No.  Sweats No.  Weight change No.     OPHTHALMOLOGY:         Blurring of vision no.  Change in vision no.  Double vision no.     ENT:         Dizziness no.  Nose bleeds no.  Sore throat no.  Teeth pain no.     ALLERGY:         Hives no.     CARDIOLOGY:         Chest pain no.   High blood pressure no.  Irregular heart beat no.  Leg edema no.  Palpitations no.     RESPIRATORY:         Shortness of breath no.  Cough no.  Wheezing no.     UROLOGY:         Pain with urination no.  Urinary urgency no.  Urinary frequency no.  Urinary incontinence no.  Difficulty urinating No.  Blood in urine No.     GASTROENTEROLOGY:         Abdominal pain no.  Appetite change no.  Bloating/belching no.  Blood in stool or on toilet paper no.  Change in bowel movements no.  Constipation no.  Diarrhea no.  Difficulty swallowing no.  Nausea no.     FEMALE REPRODUCTIVE:         Vulvar pain no.  Vulvar rash no.  Abnormal vaginal bleeding , heavy menses.  Breast pain no.  Nipple discharge no.  Pain with intercourse no.  Pelvic pain no.  Unusual vaginal discharge no.  Vaginal itching no.     MUSCULOSKELETAL:         Muscle aches no.     NEUROLOGY:         Headache no.  Tingling/numbness no.  Weakness no.     PSYCHOLOGY:         Depression no.  Anxiety no.  Nervousness no.  Sleep disturbances no.  Suicidal ideation no .     ENDOCRINOLOGY:         Excessive thirst no.  Excessive urination no.  Hair loss no.  Heat or cold intolerance no.     HEMATOLOGY/LYMPH:         Abnormal bleeding no.  Easy bruising no.  Swollen glands no.     DERMATOLOGY:         New/changing skin lesion no.  Rash no.  Sores no.     Vital Signs Wt: 192.0, Wt change: 4.6 lbs, Ht: 62.25, BMI: 34.83, Pulse sitting: 80, BP sitting: 126/81. Examination General Examination:        CONSTITUTIONAL: alert, oriented, NAD. SKIN:  moist, warm. EYES:  Conjunctiva clear. LUNGS: good I:E efffort noted, clear to auscultation bilaterally. HEART: regular rate and rhythm. ABDOMEN: soft, non-tender/non-distended, bowel sounds present. FEMALE GENITOURINARY: normal external genitalia, labia - unremarkable, vagina - pink moist mucosa, no lesions or abnormal discharge, cervix - no discharge or lesions or CMT, adnexa - no masses or tenderness, uterus  - nontender and 14 week size on palpation. EXTREMITIES: no edema present. PSYCH:  affect normal, good eye contact.  Physical Examination Chaperone present:         Chaperone present  Debby Freiberg 11/13/2022 09:23:17 AM >, for pelvic exam.      Assessments 1. Menorrhagia with irregular cycle - N92.1 (Primary) 2. Thickened endometrium - R93.89 3. Fibroids -  D25.9 Treatment 1. Menorrhagia with irregular cycle  Notes: Planning robotic assisted laparocopic hysterectomy with bilateral salpingectomy. Pt advised she will stay overnight, or 2 days if conversion to larger incision. She is advised that in order to be discharged from hospital, she will need to be able to ambulate, urinate, tolerate food, and take pain medication by mouth. Discussed risks of hysterectomy including but not limited to infection, bleeding, conversion to larger incision, damage to her bowel, bladder, or ureters, with the need for further surgery. Discussed risk of blood transfusion and risk of HIV or hep B&C with blood transfusion. Pt is aware of risks and desires blood transfusion if needed. Pt advised to avoid NSAIDs (Aspirin, Aleve, Advil, Ibuprofen, Motrin) from now until surgery given risk of bleeding during surgery. She may take Tylenol for pain management. She is advised to avoid eating or drinking starting midnight prior to surgery. Discussed post-surgery avoidance of driving for 1 week and avoidance of lifting weight greater than 10 lbs or intercourse for 6-8 weeks after procedure.    2. Thickened endometrium  Notes: Planning robotic assisted laparocopic hysterectomy with bilateral salpingectomy. Pt advised she will stay overnight, or 2 days if conversion to larger incision. She is advised that in order to be discharged from hospital, she will need to be able to ambulate, urinate, tolerate food, and take pain medication by mouth. Discussed risks of hysterectomy including but not limited to infection, bleeding, conversion to  larger incision, damage to her bowel, bladder, or ureters, with the need for further surgery. Discussed risk of blood transfusion and risk of HIV or hep B&C with blood transfusion. Pt is aware of risks and desires blood transfusion if needed. Pt advised to avoid NSAIDs (Aspirin, Aleve, Advil, Ibuprofen, Motrin) from now until surgery given risk of bleeding during surgery. She may take Tylenol for pain management. She is advised to avoid eating or drinking starting midnight prior to surgery. Discussed post-surgery avoidance of driving for 1 week and avoidance of lifting weight greater than 10 lbs or intercourse for 6-8 weeks after procedure.    3. Fibroids  Notes: Planning robotic assisted laparocopic hysterectomy with bilateral salpingectomy. Pt advised she will stay overnight, or 2 days if conversion to larger incision. She is advised that in order to be discharged from hospital, she will need to be able to ambulate, urinate, tolerate food, and take pain medication by mouth. Discussed risks of hysterectomy including but not limited to infection, bleeding, conversion to larger incision, damage to her bowel, bladder, or ureters, with the need for further surgery. Discussed risk of blood transfusion and risk of HIV or hep B&C with blood transfusion. Pt is aware of risks and desires blood transfusion if needed. Pt advised to avoid NSAIDs (Aspirin, Aleve, Advil, Ibuprofen, Motrin) from now until surgery given risk of bleeding during surgery. She may take Tylenol for pain management. She is advised to avoid eating or drinking starting midnight prior to surgery. Discussed post-surgery avoidance of driving for 1 week and avoidance of lifting weight greater than 10 lbs or intercourse for 6-8 weeks after procedure.

## 2022-11-27 ENCOUNTER — Other Ambulatory Visit: Payer: Self-pay

## 2022-11-27 ENCOUNTER — Ambulatory Visit (HOSPITAL_BASED_OUTPATIENT_CLINIC_OR_DEPARTMENT_OTHER): Payer: Managed Care, Other (non HMO) | Admitting: Anesthesiology

## 2022-11-27 ENCOUNTER — Observation Stay (HOSPITAL_BASED_OUTPATIENT_CLINIC_OR_DEPARTMENT_OTHER)
Admission: RE | Admit: 2022-11-27 | Discharge: 2022-11-27 | Disposition: A | Discharge status: Home / Self Care | Payer: Managed Care, Other (non HMO) | Attending: Obstetrics and Gynecology | Admitting: Obstetrics and Gynecology

## 2022-11-27 ENCOUNTER — Encounter (HOSPITAL_BASED_OUTPATIENT_CLINIC_OR_DEPARTMENT_OTHER)
Admission: RE | Disposition: A | Discharge status: Home / Self Care | Payer: Self-pay | Source: Home / Self Care | Attending: Obstetrics and Gynecology

## 2022-11-27 ENCOUNTER — Encounter (HOSPITAL_BASED_OUTPATIENT_CLINIC_OR_DEPARTMENT_OTHER): Payer: Self-pay | Admitting: Obstetrics and Gynecology

## 2022-11-27 DIAGNOSIS — N920 Excessive and frequent menstruation with regular cycle: Secondary | ICD-10-CM

## 2022-11-27 DIAGNOSIS — N8500 Endometrial hyperplasia, unspecified: Secondary | ICD-10-CM | POA: Diagnosis not present

## 2022-11-27 DIAGNOSIS — D259 Leiomyoma of uterus, unspecified: Secondary | ICD-10-CM | POA: Diagnosis not present

## 2022-11-27 DIAGNOSIS — N736 Female pelvic peritoneal adhesions (postinfective): Secondary | ICD-10-CM

## 2022-11-27 DIAGNOSIS — D219 Benign neoplasm of connective and other soft tissue, unspecified: Secondary | ICD-10-CM | POA: Diagnosis present

## 2022-11-27 DIAGNOSIS — N921 Excessive and frequent menstruation with irregular cycle: Secondary | ICD-10-CM | POA: Diagnosis not present

## 2022-11-27 DIAGNOSIS — Z01818 Encounter for other preprocedural examination: Secondary | ICD-10-CM

## 2022-11-27 DIAGNOSIS — Z90710 Acquired absence of both cervix and uterus: Secondary | ICD-10-CM | POA: Diagnosis present

## 2022-11-27 HISTORY — DX: Excessive and frequent menstruation with irregular cycle: N92.1

## 2022-11-27 HISTORY — DX: Iron deficiency anemia, unspecified: D50.9

## 2022-11-27 HISTORY — DX: Presence of spectacles and contact lenses: Z97.3

## 2022-11-27 HISTORY — DX: Abnormal findings on diagnostic imaging of other specified body structures: R93.89

## 2022-11-27 HISTORY — DX: Leiomyoma of uterus, unspecified: D25.9

## 2022-11-27 HISTORY — PX: ROBOTIC ASSISTED LAPAROSCOPIC HYSTERECTOMY AND SALPINGECTOMY: SHX6379

## 2022-11-27 LAB — TYPE AND SCREEN

## 2022-11-27 LAB — POCT PREGNANCY, URINE: Preg Test, Ur: NEGATIVE

## 2022-11-27 LAB — ABO/RH: ABO/RH(D): O POS

## 2022-11-27 SURGERY — XI ROBOTIC ASSISTED LAPAROSCOPIC HYSTERECTOMY AND SALPINGECTOMY
Anesthesia: General | Site: Abdomen | Laterality: Bilateral

## 2022-11-27 MED ORDER — SODIUM CHLORIDE 0.9 % IR SOLN
Status: DC | PRN
  Administered 2022-11-27: 1000 mL

## 2022-11-27 MED ORDER — FENTANYL CITRATE (PF) 100 MCG/2ML IJ SOLN
INTRAMUSCULAR | Status: DC | PRN
  Administered 2022-11-27 (×2): 50 ug via INTRAVENOUS
  Administered 2022-11-27: 25 ug via INTRAVENOUS
  Administered 2022-11-27: 50 ug via INTRAVENOUS
  Administered 2022-11-27: 25 ug via INTRAVENOUS

## 2022-11-27 MED ORDER — ONDANSETRON HCL 4 MG/2ML IJ SOLN
INTRAMUSCULAR | Status: AC
  Filled 2022-11-27: qty 2

## 2022-11-27 MED ORDER — KETOROLAC TROMETHAMINE 30 MG/ML IJ SOLN
30.0000 mg | Freq: Once | INTRAMUSCULAR | Status: AC
  Administered 2022-11-27: 30 mg via INTRAVENOUS

## 2022-11-27 MED ORDER — MIDAZOLAM HCL 2 MG/2ML IJ SOLN
INTRAMUSCULAR | Status: DC | PRN
  Administered 2022-11-27: 2 mg via INTRAVENOUS

## 2022-11-27 MED ORDER — SCOPOLAMINE 1 MG/3DAYS TD PT72
1.0000 | MEDICATED_PATCH | TRANSDERMAL | Status: DC
  Administered 2022-11-27: 1.5 mg via TRANSDERMAL

## 2022-11-27 MED ORDER — LACTATED RINGERS IV SOLN: INTRAVENOUS | Status: DC

## 2022-11-27 MED ORDER — PANTOPRAZOLE SODIUM 40 MG PO TBEC
DELAYED_RELEASE_TABLET | ORAL | Status: AC
  Filled 2022-11-27: qty 1

## 2022-11-27 MED ORDER — ONDANSETRON HCL 4 MG PO TABS: 4.0000 mg | ORAL_TABLET | Freq: Four times a day (QID) | ORAL | Status: DC | PRN

## 2022-11-27 MED ORDER — ACETAMINOPHEN 500 MG PO TABS: 1000.0000 mg | ORAL_TABLET | Freq: Once | ORAL | Status: DC

## 2022-11-27 MED ORDER — FENTANYL CITRATE (PF) 100 MCG/2ML IJ SOLN
25.0000 ug | INTRAMUSCULAR | Status: DC | PRN
  Administered 2022-11-27 (×2): 50 ug via INTRAVENOUS

## 2022-11-27 MED ORDER — ROCURONIUM BROMIDE 10 MG/ML (PF) SYRINGE
PREFILLED_SYRINGE | INTRAVENOUS | Status: DC | PRN
  Administered 2022-11-27: 100 mg via INTRAVENOUS

## 2022-11-27 MED ORDER — GABAPENTIN 300 MG PO CAPS
300.0000 mg | ORAL_CAPSULE | ORAL | Status: AC
  Administered 2022-11-27: 300 mg via ORAL

## 2022-11-27 MED ORDER — ROCURONIUM BROMIDE 10 MG/ML (PF) SYRINGE
PREFILLED_SYRINGE | INTRAVENOUS | Status: AC
  Filled 2022-11-27: qty 10

## 2022-11-27 MED ORDER — SENNA 8.6 MG PO TABS
1.0000 | ORAL_TABLET | Freq: Two times a day (BID) | ORAL | Status: DC
  Administered 2022-11-27: 8.6 mg via ORAL

## 2022-11-27 MED ORDER — OXYCODONE HCL 5 MG PO TABS: 5.0000 mg | ORAL_TABLET | ORAL | 0 refills | Status: AC | PRN

## 2022-11-27 MED ORDER — EPHEDRINE SULFATE-NACL 50-0.9 MG/10ML-% IV SOSY
PREFILLED_SYRINGE | INTRAVENOUS | Status: DC | PRN
  Administered 2022-11-27: 10 mg via INTRAVENOUS

## 2022-11-27 MED ORDER — IBUPROFEN 200 MG PO TABS: 600.0000 mg | ORAL_TABLET | Freq: Four times a day (QID) | ORAL | Status: DC

## 2022-11-27 MED ORDER — STERILE WATER FOR IRRIGATION IR SOLN
Status: DC | PRN
  Administered 2022-11-27: 100 mL
  Administered 2022-11-27: 500 mL

## 2022-11-27 MED ORDER — ACETAMINOPHEN 500 MG PO TABS: 1000.0000 mg | ORAL_TABLET | Freq: Three times a day (TID) | ORAL | 0 refills | Status: AC | PRN

## 2022-11-27 MED ORDER — PANTOPRAZOLE SODIUM 40 MG PO TBEC
40.0000 mg | DELAYED_RELEASE_TABLET | Freq: Every day | ORAL | Status: DC
  Administered 2022-11-27: 40 mg via ORAL

## 2022-11-27 MED ORDER — ACETAMINOPHEN 500 MG PO TABS
1000.0000 mg | ORAL_TABLET | ORAL | Status: AC
  Administered 2022-11-27: 1000 mg via ORAL

## 2022-11-27 MED ORDER — ONDANSETRON HCL 4 MG/2ML IJ SOLN
INTRAMUSCULAR | Status: DC | PRN
  Administered 2022-11-27: 4 mg via INTRAVENOUS

## 2022-11-27 MED ORDER — MIDAZOLAM HCL 2 MG/2ML IJ SOLN
INTRAMUSCULAR | Status: AC
  Filled 2022-11-27: qty 2

## 2022-11-27 MED ORDER — PROMETHAZINE HCL 25 MG/ML IJ SOLN: 6.2500 mg | INTRAMUSCULAR | Status: DC | PRN

## 2022-11-27 MED ORDER — METHYLENE BLUE 1 % INJ SOLN
INTRAVENOUS | Status: DC | PRN
  Administered 2022-11-27: 10 mL via SUBMUCOSAL

## 2022-11-27 MED ORDER — GABAPENTIN 300 MG PO CAPS
ORAL_CAPSULE | ORAL | Status: AC
  Filled 2022-11-27: qty 1

## 2022-11-27 MED ORDER — SUGAMMADEX SODIUM 200 MG/2ML IV SOLN
INTRAVENOUS | Status: DC | PRN
  Administered 2022-11-27: 200 mg via INTRAVENOUS

## 2022-11-27 MED ORDER — BUPIVACAINE LIPOSOME 1.3 % IJ SUSP
INTRAMUSCULAR | Status: DC | PRN
  Administered 2022-11-27: 20 mL

## 2022-11-27 MED ORDER — PROPOFOL 10 MG/ML IV BOLUS
INTRAVENOUS | Status: DC | PRN
  Administered 2022-11-27: 200 mg via INTRAVENOUS

## 2022-11-27 MED ORDER — ZOLPIDEM TARTRATE 5 MG PO TABS: 5.0000 mg | ORAL_TABLET | Freq: Every evening | ORAL | Status: DC | PRN

## 2022-11-27 MED ORDER — FENTANYL CITRATE (PF) 100 MCG/2ML IJ SOLN
INTRAMUSCULAR | Status: AC
  Filled 2022-11-27: qty 2

## 2022-11-27 MED ORDER — LIDOCAINE 2% (20 MG/ML) 5 ML SYRINGE
INTRAMUSCULAR | Status: DC | PRN
  Administered 2022-11-27: 60 mg via INTRAVENOUS

## 2022-11-27 MED ORDER — KETOROLAC TROMETHAMINE 30 MG/ML IJ SOLN
INTRAMUSCULAR | Status: DC | PRN
  Administered 2022-11-27: 30 mg via INTRAVENOUS

## 2022-11-27 MED ORDER — LIDOCAINE HCL (PF) 2 % IJ SOLN
INTRAMUSCULAR | Status: AC
  Filled 2022-11-27: qty 5

## 2022-11-27 MED ORDER — POVIDONE-IODINE 10 % EX SWAB: 2.0000 | Freq: Once | CUTANEOUS | Status: DC

## 2022-11-27 MED ORDER — ACETAMINOPHEN 500 MG PO TABS
1000.0000 mg | ORAL_TABLET | Freq: Four times a day (QID) | ORAL | Status: DC
  Administered 2022-11-27: 1000 mg via ORAL

## 2022-11-27 MED ORDER — DEXAMETHASONE SODIUM PHOSPHATE 10 MG/ML IJ SOLN
INTRAMUSCULAR | Status: AC
  Filled 2022-11-27: qty 1

## 2022-11-27 MED ORDER — IBUPROFEN 600 MG PO TABS: 600.0000 mg | ORAL_TABLET | Freq: Four times a day (QID) | ORAL | 1 refills | Status: AC | PRN

## 2022-11-27 MED ORDER — SENNA 8.6 MG PO TABS
ORAL_TABLET | ORAL | Status: AC
  Filled 2022-11-27: qty 1

## 2022-11-27 MED ORDER — SODIUM CHLORIDE 0.9 % IV SOLN
INTRAVENOUS | Status: DC | PRN
  Administered 2022-11-27: 10 mL
  Administered 2022-11-27: 60 mL

## 2022-11-27 MED ORDER — SCOPOLAMINE 1 MG/3DAYS TD PT72
MEDICATED_PATCH | TRANSDERMAL | Status: AC
  Filled 2022-11-27: qty 1

## 2022-11-27 MED ORDER — ACETAMINOPHEN 500 MG PO TABS
ORAL_TABLET | ORAL | Status: AC
  Filled 2022-11-27: qty 2

## 2022-11-27 MED ORDER — ONDANSETRON HCL 4 MG/2ML IJ SOLN: 4.0000 mg | Freq: Four times a day (QID) | INTRAMUSCULAR | Status: DC | PRN

## 2022-11-27 MED ORDER — ALUM & MAG HYDROXIDE-SIMETH 200-200-20 MG/5ML PO SUSP: 30.0000 mL | ORAL | Status: DC | PRN

## 2022-11-27 MED ORDER — SIMETHICONE 80 MG PO CHEW: 80.0000 mg | CHEWABLE_TABLET | Freq: Four times a day (QID) | ORAL | Status: DC | PRN

## 2022-11-27 MED ORDER — PROPOFOL 10 MG/ML IV BOLUS
INTRAVENOUS | Status: AC
  Filled 2022-11-27: qty 20

## 2022-11-27 MED ORDER — MENTHOL 3 MG MT LOZG: 1.0000 | LOZENGE | OROMUCOSAL | Status: DC | PRN

## 2022-11-27 MED ORDER — PHENYLEPHRINE 80 MCG/ML (10ML) SYRINGE FOR IV PUSH (FOR BLOOD PRESSURE SUPPORT)
PREFILLED_SYRINGE | INTRAVENOUS | Status: DC | PRN
  Administered 2022-11-27: 80 ug via INTRAVENOUS

## 2022-11-27 MED ORDER — SODIUM CHLORIDE 0.9 % IV SOLN
INTRAVENOUS | Status: AC
  Filled 2022-11-27: qty 2

## 2022-11-27 MED ORDER — DEXAMETHASONE SODIUM PHOSPHATE 10 MG/ML IJ SOLN
INTRAMUSCULAR | Status: DC | PRN
  Administered 2022-11-27 (×2): 5 mg via INTRAVENOUS

## 2022-11-27 MED ORDER — KETOROLAC TROMETHAMINE 30 MG/ML IJ SOLN
INTRAMUSCULAR | Status: AC
  Filled 2022-11-27: qty 1

## 2022-11-27 MED ORDER — OXYCODONE HCL 5 MG PO TABS
ORAL_TABLET | ORAL | Status: AC
  Filled 2022-11-27: qty 1

## 2022-11-27 MED ORDER — HYDROMORPHONE HCL 1 MG/ML IJ SOLN: 0.2000 mg | INTRAMUSCULAR | Status: DC | PRN

## 2022-11-27 MED ORDER — OXYCODONE HCL 5 MG PO TABS
5.0000 mg | ORAL_TABLET | ORAL | Status: DC | PRN
  Administered 2022-11-27: 5 mg via ORAL

## 2022-11-27 MED ORDER — AMISULPRIDE (ANTIEMETIC) 5 MG/2ML IV SOLN: 10.0000 mg | Freq: Once | INTRAVENOUS | Status: DC | PRN

## 2022-11-27 MED ORDER — HEMOSTATIC AGENTS (NO CHARGE) OPTIME
TOPICAL | Status: DC | PRN
  Administered 2022-11-27: 1 via TOPICAL

## 2022-11-27 MED ORDER — SODIUM CHLORIDE 0.9 % IV SOLN
2.0000 g | INTRAVENOUS | Status: AC
  Administered 2022-11-27: 2 g via INTRAVENOUS

## 2022-11-27 SURGICAL SUPPLY — 84 items
ADH SKN CLS APL DERMABOND .7 (GAUZE/BANDAGES/DRESSINGS) ×2
APL SRG 38 LTWT LNG FL B (MISCELLANEOUS) ×1
APPLICATOR ARISTA FLEXITIP XL (MISCELLANEOUS) IMPLANT
BAG DECANTER FOR FLEXI CONT (MISCELLANEOUS) IMPLANT
BARRIER ADHS 3X4 INTERCEED (GAUZE/BANDAGES/DRESSINGS) IMPLANT
BRR ADH 4X3 ABS CNTRL BYND (GAUZE/BANDAGES/DRESSINGS)
CANNULA CAP OBTURATR AIRSEAL 8 (CAP) IMPLANT
CATH FOLEY 3WAY  5CC 16FR (CATHETERS) ×1
CATH FOLEY 3WAY 5CC 16FR (CATHETERS) ×1 IMPLANT
CAUTERY HOOK MNPLR 1.6 DVNC XI (INSTRUMENTS) IMPLANT
CELLS DAT CNTRL 66122 CELL SVR (MISCELLANEOUS) IMPLANT
COVER BACK TABLE 60X90IN (DRAPES) ×1 IMPLANT
COVER TIP SHEARS 8 DVNC (MISCELLANEOUS) ×1 IMPLANT
DEFOGGER SCOPE WARMER CLEARIFY (MISCELLANEOUS) ×1 IMPLANT
DERMABOND ADVANCED .7 DNX12 (GAUZE/BANDAGES/DRESSINGS) ×1 IMPLANT
DILATOR CANAL MILEX (MISCELLANEOUS) ×1 IMPLANT
DRAPE ARM DVNC X/XI (DISPOSABLE) ×4 IMPLANT
DRAPE COLUMN DVNC XI (DISPOSABLE) ×1 IMPLANT
DRAPE SURG IRRIG POUCH 19X23 (DRAPES) ×1 IMPLANT
DRAPE UTILITY XL STRL (DRAPES) ×1 IMPLANT
DRIVER NDL MEGA 8 DVNC XI (INSTRUMENTS) ×1 IMPLANT
DRIVER NDLE MEGA DVNC XI (INSTRUMENTS) ×1 IMPLANT
DURAPREP 26ML APPLICATOR (WOUND CARE) ×1 IMPLANT
ELECT REM PT RETURN 9FT ADLT (ELECTROSURGICAL) ×1
ELECTRODE REM PT RTRN 9FT ADLT (ELECTROSURGICAL) ×1 IMPLANT
FORCEPS BPLR LNG DVNC XI (INSTRUMENTS) ×1 IMPLANT
FORCEPS PROGRASP DVNC XI (FORCEP) IMPLANT
GAUZE 4X4 16PLY ~~LOC~~+RFID DBL (SPONGE) IMPLANT
GLOVE BIOGEL M 6.5 STRL (GLOVE) ×3 IMPLANT
GLOVE BIOGEL PI IND STRL 6.5 (GLOVE) ×3 IMPLANT
GRASPER COBRA DVNC RU (INSTRUMENTS) ×1 IMPLANT
HEMOSTAT ARISTA ABSORB 3G PWDR (HEMOSTASIS) IMPLANT
HIBICLENS CHG 4% 4OZ BTL (MISCELLANEOUS) IMPLANT
HOLDER FOLEY CATH W/STRAP (MISCELLANEOUS) IMPLANT
IRRIG SUCT STRYKERFLOW 2 WTIP (MISCELLANEOUS) ×1
IRRIGATION SUCT STRKRFLW 2 WTP (MISCELLANEOUS) ×1 IMPLANT
IV NS 1000ML (IV SOLUTION) ×1
IV NS 1000ML BAXH (IV SOLUTION) IMPLANT
KIT PINK PAD W/HEAD ARE REST (MISCELLANEOUS) ×1
KIT PINK PAD W/HEAD ARM REST (MISCELLANEOUS) ×1 IMPLANT
KIT TURNOVER CYSTO (KITS) ×1 IMPLANT
LEGGING LITHOTOMY PAIR STRL (DRAPES) ×1 IMPLANT
MANIFOLD NEPTUNE II (INSTRUMENTS) ×1 IMPLANT
NDL HYPO 18GX1.5 BLUNT FILL (NEEDLE) IMPLANT
NEEDLE HYPO 18GX1.5 BLUNT FILL (NEEDLE) ×2 IMPLANT
OBTURATOR OPTICAL STND 8 DVNC (TROCAR) ×1
OBTURATOR OPTICALSTD 8 DVNC (TROCAR) ×1 IMPLANT
OCCLUDER COLPOPNEUMO (BALLOONS) ×1 IMPLANT
PACK ROBOT WH (CUSTOM PROCEDURE TRAY) ×1 IMPLANT
PACK ROBOTIC GOWN (GOWN DISPOSABLE) ×1 IMPLANT
PAD OB MATERNITY 4.3X12.25 (PERSONAL CARE ITEMS) ×1 IMPLANT
PAD PREP 24X48 CUFFED NSTRL (MISCELLANEOUS) ×1 IMPLANT
PROTECTOR NERVE ULNAR (MISCELLANEOUS) ×1 IMPLANT
RETRACTOR WND ALEXIS 18 MED (MISCELLANEOUS) IMPLANT
RTRCTR WOUND ALEXIS 18CM MED (MISCELLANEOUS)
RTRCTR WOUND ALEXIS 18CM SML (INSTRUMENTS)
SAVER CELL AAL HAEMONETICS (INSTRUMENTS) IMPLANT
SCISSORS LAP 5X45 EPIX DISP (ENDOMECHANICALS) IMPLANT
SCISSORS MNPLR CVD DVNC XI (INSTRUMENTS) ×1 IMPLANT
SEAL UNIV 5-12 XI (MISCELLANEOUS) ×2 IMPLANT
SEALER VESSEL EXT DVNC XI (MISCELLANEOUS) IMPLANT
SET IRRIG Y TYPE TUR BLADDER L (SET/KITS/TRAYS/PACK) IMPLANT
SET TRI-LUMEN FLTR TB AIRSEAL (TUBING) IMPLANT
SET TUBE FILTERED XL AIRSEAL (SET/KITS/TRAYS/PACK) IMPLANT
SLEEVE SCD COMPRESS KNEE MED (STOCKING) ×1 IMPLANT
SPIKE FLUID TRANSFER (MISCELLANEOUS) ×2 IMPLANT
SUT VIC AB 0 CT1 27 (SUTURE) ×2
SUT VIC AB 0 CT1 27XBRD ANBCTR (SUTURE) ×2 IMPLANT
SUT VIC AB 3-0 CT1 36 (SUTURE) IMPLANT
SUT VICRYL 0 UR6 27IN ABS (SUTURE) IMPLANT
SUT VICRYL RAPIDE 4/0 PS 2 (SUTURE) ×2 IMPLANT
SUT VLOC 180 0 9IN  GS21 (SUTURE) ×1
SUT VLOC 180 0 9IN GS21 (SUTURE) ×1 IMPLANT
SYR 10ML LL (SYRINGE) IMPLANT
TIP RUMI ORANGE 6.7MMX12CM (TIP) IMPLANT
TIP UTERINE 5.1X6CM LAV DISP (MISCELLANEOUS) IMPLANT
TIP UTERINE 6.7X10CM GRN DISP (MISCELLANEOUS) IMPLANT
TIP UTERINE 6.7X6CM WHT DISP (MISCELLANEOUS) IMPLANT
TIP UTERINE 6.7X8CM BLUE DISP (MISCELLANEOUS) IMPLANT
TOWEL OR 17X24 6PK STRL BLUE (TOWEL DISPOSABLE) ×1 IMPLANT
TROCAR PORT AIRSEAL 8X120 (TROCAR) IMPLANT
WATER STERILE IRR 1000ML POUR (IV SOLUTION) ×1 IMPLANT
WATER STERILE IRR 3000ML UROMA (IV SOLUTION) IMPLANT
WATER STERILE IRR 500ML POUR (IV SOLUTION) IMPLANT

## 2022-11-27 NOTE — Anesthesia Postprocedure Evaluation (Signed)
Anesthesia Post Note  Patient: Regina Chung  Procedure(s) Performed: XI ROBOTIC ASSISTED LAPAROSCOPIC HYSTERECTOMY AND SALPINGECTOMY (Bilateral: Abdomen)     Patient location during evaluation: PACU Anesthesia Type: General Level of consciousness: sedated Pain management: pain level controlled Vital Signs Assessment: post-procedure vital signs reviewed and stable Respiratory status: spontaneous breathing and respiratory function stable Cardiovascular status: stable Postop Assessment: no apparent nausea or vomiting Anesthetic complications: no   No notable events documented.  Last Vitals:  Vitals:   11/27/22 1145 11/27/22 1200  BP: (!) 137/90 137/83  Pulse: 63 62  Resp: 11 (!) 9  Temp:    SpO2: 94% 94%    Last Pain:  Vitals:   11/27/22 1130  TempSrc:   PainSc: 3                  Libbi Towner DANIEL

## 2022-11-27 NOTE — Discharge Summary (Signed)
Physician Discharge Summary  Patient ID: Regina Chung MRN: [id number] DOB/AGE: 16-Dec-1970 52 y.o.  Admit date: 11/27/2022 Discharge date: 11/27/2022  Admission Diagnoses:Menorrhagia and fibroids/ s/p rototic assisted laparoscopic hysterectomy with bilateral salpingectomy   Discharge Diagnoses:  Principal Problem:   Fibroids Active Problems:   S/P laparoscopic hysterectomy   Menorrhagia   Discharged Condition: stable  Hospital Course: pt was admitted for observation after undergoing robotic assisted laparoscopic hysterectomy with bilateral salpingectomy . She did well postoperatively with return of bladder function. Tolerating po and ambulation. She request discharge home on pod #0   Consults: None  Significant Diagnostic Studies:  Results for orders placed or performed during the hospital encounter of 11/27/22 (from the past 72 hour(s))  Pregnancy, urine POC     Status: None   Collection Time: 11/27/22  5:45 AM  Result Value Ref Range   Preg Test, Ur NEGATIVE NEGATIVE    Comment:        THE SENSITIVITY OF THIS METHODOLOGY IS >24 mIU/mL   ABO/Rh     Status: None   Collection Time: 11/27/22  6:14 AM  Result Value Ref Range   ABO/RH(D)      O POS Performed at [facility], [address], [place], [place] [address]      Treatments: surgery: robotic assisted laparoscopic hysterectomy with bilateral salpingectomy and extensive lysis of adhesions   Discharge Exam: Blood pressure (!) 147/89, pulse 65, temperature 98.7 F (37.1 C), temperature source Oral, resp. rate 14, height  (1.575 m), weight 85.5 kg, last menstrual period 11/14/2022, SpO2 99 %. General appearance: alert, cooperative, and no distress GI: soft  Extremities: extremities normal, atraumatic, no cyanosis or edema Incision/Wound: well approximated no erythema or exudate   Disposition: Discharge disposition: 01-Home or Self Care       Discharge Instructions     Call MD for:   persistant nausea and vomiting   Complete by: As directed    Call MD for:  redness, tenderness, or signs of infection (pain, swelling, redness, odor or green/yellow discharge around incision site)   Complete by: As directed    Call MD for:  severe uncontrolled pain   Complete by: As directed    Call MD for:  temperature >100.4   Complete by: As directed    Diet general   Complete by: As directed    Driving Restrictions   Complete by: As directed    Avoid driving for 1 week   Increase activity slowly   Complete by: As directed    Lifting restrictions   Complete by: As directed    Avoid lifting over 10 lbs   May shower / Bathe   Complete by: As directed    May walk up steps   Complete by: As directed    No wound care   Complete by: As directed    Sexual Activity Restrictions   Complete by: As directed    Avoid sexual activity for 6 weeks and until approved by Dr. Richardson Dopp      Allergies as of 11/27/2022   No Known Allergies      Medication List     STOP taking these medications    ferrous sulfate 324 MG Tbec       TAKE these medications    acetaminophen 500 MG tablet Commonly known as: TYLENOL Take 2 tablets (1,000 mg total) by mouth every 8 (eight) hours as needed for mild pain or moderate pain.   ibuprofen 600 MG  tablet Commonly known as: ADVIL Take 1 tablet (600 mg total) by mouth every 6 (six) hours as needed for mild pain or moderate pain.   oxyCODONE 5 MG immediate release tablet Commonly known as: Oxy IR/ROXICODONE Take 1-2 tablets (5-10 mg total) by mouth every 4 (four) hours as needed for moderate pain or severe pain.        Follow-up Information     Gerald Leitz, MD. Go in 2 week(s).   Specialty: Obstetrics and Gynecology Contact information: [address] [place] [address] [phone]                 Signed: Gerald Leitz 11/27/2022, 3:57 PM

## 2022-11-27 NOTE — Transfer of Care (Signed)
Immediate Anesthesia Transfer of Care Note  Patient: [name]  Procedure(s) Performed: Procedure(s) (LRB): XI ROBOTIC ASSISTED LAPAROSCOPIC HYSTERECTOMY AND SALPINGECTOMY (Bilateral)  Patient Location: PACU  Anesthesia Type: General  Level of Consciousness: awake, oriented, sedated and patient cooperative  Airway & Oxygen Therapy: Patient Spontanous Breathing and Patient connected to face mask oxygen  Post-op Assessment: Report given to PACU RN and Post -op Vital signs reviewed and stable  Post vital signs: Reviewed and stable  Complications: No apparent anesthesia complications Vitals Value Taken Time  BP 134/95 11/27/22 1038  Temp 36.7 C 11/27/22 1038  Pulse 70 11/27/22 1041  Resp 15 11/27/22 1041  SpO2 100 % 11/27/22 1041  Vitals shown include unvalidated device data.  Last Pain:  Vitals:   11/27/22 0600  TempSrc: Oral  PainSc: 0-No pain      Patients Stated Pain Goal: 6 (11/27/22 0600)  Complications: No notable events documented.

## 2022-11-27 NOTE — Op Note (Signed)
11/27/2022  11:28 AM  PATIENT:  Regina Chung  52 y.o. female  PRE-OPERATIVE DIAGNOSIS:  Fibroids Menorrhagia with Irregular Cycle Thickened Endometrium  POST-OPERATIVE DIAGNOSIS:  Fibroids Menorrhagia with Irregular Cycle Thickened Endometrium. Pelvic adhesive disease   PROCEDURE:  Procedure(s): XI ROBOTIC ASSISTED LAPAROSCOPIC HYSTERECTOMY AND SALPINGECTOMY (Bilateral) Extensive lysis of Adhesions.   SURGEON:  Surgeon(s) and Role:    * Gerald Leitz, MD - Primary  PHYSICIAN ASSISTANT:   ASSISTANTS: Karmen Stabs RNFA and Freda Jackson RNFA in training assisted due to complexity of the anatomy.    ANESTHESIA:   general  EBL:  50 mL   BLOOD ADMINISTERED:none  DRAINS: Urinary Catheter (Foley)   LOCAL MEDICATIONS USED:  OTHER ropivicaine and exparel  SPECIMEN:  Source of Specimen:  Uterus, cervix and bilateral fallopian tubes.   DISPOSITION OF SPECIMEN:  PATHOLOGY  COUNTS:  YES  TOURNIQUET:  * No tourniquets in log *  DICTATION: .Note written in EPIC  PLAN OF CARE: Admit for overnight observation  PATIENT DISPOSITION:  PACU - hemodynamically stable.   Delay start of Pharmacological VTE agent (>24hrs) due to surgical blood loss or risk of bleeding: not applicable  Findings: normal external genitalia, normal vaginal mucosa and cervix. Large fibroid uterus with adhesions of the uterus to the anterior abdominal wall. Adhesions of the omentum to the anterior abdominal wall.   Specimen weight = 410 grams.   Procedure: The patient was taken to the operating room where she was placed under general anesthesia.Time out was performed. Marland Kitchen She was placed in dorsal lithotomy position and prepped and draped in the usual sterile fashion. A weighted speculum was placed into the vagina. A Deaver was placed anteriorly for retraction. The anterior lip of the cervix was grasped with a single-tooth tenaculum. The vaginal mucosa was injected with 2.5 cc of ropivacaine at the 2/4/ 8 and 10  o'clock positions. The uterus was sounded to 10 cm. the cervix was dilated to 6 mm . 0 vicryl suture placed at the 12 and 6:00 positions Of the cervix to facilitate placement of a Ru mi uterine manipulator. The manipulator was placed without difficulty. Weighted speculum and Deaver were removed .  Attention was turned to the patient's abdomen where a 8 mm trocar was placed at the umbilicus. under direct visualization . The pneumoperitoneum was achieved with PCO2 gas. The laparoscope was removed. 60 cc of ropivacaine were injected into the abdominal cavity. The laparoscope was reinserted. An 8 mm trocar was placed in the right upper quadrant 16 centimeters from the umbilicus.later connected to robotic arm #4). An incision was made in the Right upper quadrant TROCAR WAS PLACED 8 cm from the umbilicus. Later connected to robotic arm #3. An 8 mm incision was made in the left upper quadrant 10 cm from the umbilicus and connected to robot arm #1. (All incision sites were injected with 10cc of ropivacaine prior to port placement. )  Once all ports had been placed under direct visualization.The laparoscope was removed and the da Vinci robotic system was thin right-sided docked. The robotic arms were connected to the corresponding trocars as listed above. The laparoscope was then reinserted. The long tip bipolar forceps were placed into port #1. The pro-grasp  placed in the port #4. A vessel sealer ( alternating with scissors) was placed in port #3. All instruments were directed into the pelvis under direct visualization.   Attention was turned to the surgeons console. The adhesions of omentum to the anterior abdominal and adhesions  of the uterus to the anterior abdominal wall were incised with the vessel sealer. The left mesosalpinx and left utero-ovarian ligament was cauterized and transected with the vessel sealer The broad ligament was cauterized and transected with the vessel sealer .The round ligament was  cauterized and transected with the vessel sealer  The anterior leaf of broad ligament was incised along the bladder reflection to the midline.  The right  mesosalpinx and right utero-ovarian ligament was cauterized and transected with the vessel sealer. The right broad ligament was cauterized and transected with the vessel sealer. The right round ligament was cauterized and transected with the vessel sealer The broad ligament was incised to the midline. The bladder was dissected off the lower uterine segments of the cervix via sharp and blunt dissection.   The uterine arteries were skeleton bilaterally. They were  cauterized and transected with the vessel sealer The KOH ring was identified. The anterior colpotomy was performed followed by the posterior colpotomy. Once the uterus,cervix and bilateral fallopian tubes were completely excised was removed through the vagina. The  bipolar forceps and scissors were removed and cobra forceps were placed in the port #1 and the mega needle driver was placed in to port #3.   The vaginal cuff was closed with running suture if 0 v-lock. The pelvis was irrigated. Marland KitchenMarland KitchenMarland KitchenExcellent hemostasis was noted. Arista was placed along the vaginal cuff.  All pelvic pedicles were examined and hemostasis was noted.  All instruments removed from the ports. All ports were removed under direct Visualization. The pneumoperitoneum was released. The skin incisions were closed with 4-0 Vicryl and then covered with Derma bond.   Attention was turned to the vagina. A 3 cm laceration of the posterior vaginal mucosa was noted. It was re-approximated with 3-0 vicryl. Excellent hemostasis was noted.      Sponge lap and needle counts weIre correct x 2. The patient was awakened from anesthesia and taken to the recovery room in stable condition

## 2022-11-27 NOTE — H&P (Signed)
Date of Initial H&P: 11/26/2022  History reviewed, patient examined, no change in status, stable for surgery.  

## 2022-11-27 NOTE — Anesthesia Procedure Notes (Addendum)
Procedure Name: Intubation Date/Time: 11/27/2022 7:41 AM  Performed by: Francie Massing, CRNAPre-anesthesia Checklist: Patient identified, Emergency Drugs available, Suction available and Patient being monitored Patient Re-evaluated:Patient Re-evaluated prior to induction Oxygen Delivery Method: Circle system utilized Preoxygenation: Pre-oxygenation with 100% oxygen Induction Type: IV induction Ventilation: Mask ventilation without difficulty Laryngoscope Size: Mac and 3 Grade View: Grade I Tube type: Oral Tube size: 7.0 mm Number of attempts: 1 Airway Equipment and Method: Stylet and Oral airway Placement Confirmation: ETT inserted through vocal cords under direct vision, positive ETCO2 and breath sounds checked- equal and bilateral Secured at: 22 cm Tube secured with: Tape Dental Injury: Teeth and Oropharynx as per pre-operative assessment

## 2022-11-28 ENCOUNTER — Encounter (HOSPITAL_BASED_OUTPATIENT_CLINIC_OR_DEPARTMENT_OTHER): Payer: Self-pay | Admitting: Obstetrics and Gynecology

## 2022-11-28 LAB — SURGICAL PATHOLOGY

## 2023-10-17 ENCOUNTER — Other Ambulatory Visit: Payer: Self-pay | Admitting: Physician Assistant

## 2023-10-17 DIAGNOSIS — Z1231 Encounter for screening mammogram for malignant neoplasm of breast: Secondary | ICD-10-CM

## 2023-10-29 ENCOUNTER — Ambulatory Visit
Admission: RE | Admit: 2023-10-29 | Discharge: 2023-10-29 | Disposition: A | Discharge status: Home / Self Care | Source: Ambulatory Visit | Attending: Physician Assistant | Admitting: Physician Assistant

## 2023-10-29 DIAGNOSIS — Z1231 Encounter for screening mammogram for malignant neoplasm of breast: Secondary | ICD-10-CM

## 2024-09-16 ENCOUNTER — Encounter (HOSPITAL_BASED_OUTPATIENT_CLINIC_OR_DEPARTMENT_OTHER): Payer: Self-pay

## 2024-09-16 ENCOUNTER — Other Ambulatory Visit: Payer: Self-pay

## 2024-09-16 DIAGNOSIS — K2900 Acute gastritis without bleeding: Secondary | ICD-10-CM | POA: Insufficient documentation

## 2024-09-16 LAB — CBC
HCT: 39.9 % (ref 36.0–46.0)
Hemoglobin: 13.6 g/dL (ref 12.0–15.0)
MCH: 31.6 pg (ref 26.0–34.0)
MCHC: 34.1 g/dL (ref 30.0–36.0)
MCV: 92.8 fL (ref 80.0–100.0)
Platelets: 217 10*3/uL (ref 150–400)
RBC: 4.3 MIL/uL (ref 3.87–5.11)
RDW: 12 % (ref 11.5–15.5)
WBC: 11 10*3/uL — ABNORMAL HIGH (ref 4.0–10.5)
nRBC: 0 % (ref 0.0–0.2)

## 2024-09-16 MED ORDER — ONDANSETRON HCL 4 MG/2ML IJ SOLN
4.0000 mg | Freq: Once | INTRAMUSCULAR | Status: AC | PRN
  Administered 2024-09-16: 4 mg via INTRAVENOUS
  Filled 2024-09-16: qty 2

## 2024-09-16 NOTE — ED Triage Notes (Signed)
 Pt reports nausea ongoing throughout the day. Reports severe upper abdominal pain starting around 10pm when lying down for bed that radiates into chest. Pain is sharp and constant. No aggravating/alleviating factors noted. Pt reports 3-4 episodes of vomiting since onset of pain. Denies fever, diarrhea. Pt awake and alert, NAD noted in triage.

## 2024-09-16 NOTE — ED Notes (Signed)
 Pt reports eating Pho for dinner tonight, expressed concerns for food poisoning.

## 2024-09-17 ENCOUNTER — Emergency Department (HOSPITAL_BASED_OUTPATIENT_CLINIC_OR_DEPARTMENT_OTHER)

## 2024-09-17 ENCOUNTER — Emergency Department (HOSPITAL_BASED_OUTPATIENT_CLINIC_OR_DEPARTMENT_OTHER)
Admission: EM | Admit: 2024-09-17 | Discharge: 2024-09-17 | Disposition: A | Attending: Emergency Medicine | Admitting: Emergency Medicine

## 2024-09-17 DIAGNOSIS — K2900 Acute gastritis without bleeding: Secondary | ICD-10-CM

## 2024-09-17 LAB — LIPASE, BLOOD: Lipase: 35 U/L (ref 11–51)

## 2024-09-17 LAB — COMPREHENSIVE METABOLIC PANEL WITH GFR
ALT: 71 U/L — ABNORMAL HIGH (ref 0–44)
AST: 125 U/L — ABNORMAL HIGH (ref 15–41)
Albumin: 4.6 g/dL (ref 3.5–5.0)
Alkaline Phosphatase: 91 U/L (ref 38–126)
Anion gap: 11 (ref 5–15)
BUN: 10 mg/dL (ref 6–20)
CO2: 29 mmol/L (ref 22–32)
Calcium: 9.6 mg/dL (ref 8.9–10.3)
Chloride: 100 mmol/L (ref 98–111)
Creatinine, Ser: 0.83 mg/dL (ref 0.44–1.00)
GFR, Estimated: >60 mL/min
Glucose, Bld: 101 mg/dL — ABNORMAL HIGH (ref 70–99)
Potassium: 3.6 mmol/L (ref 3.5–5.1)
Sodium: 140 mmol/L (ref 135–145)
Total Bilirubin: 0.9 mg/dL (ref 0.0–1.2)
Total Protein: 7.6 g/dL (ref 6.5–8.1)

## 2024-09-17 MED ORDER — HYDROMORPHONE HCL 1 MG/ML IJ SOLN
0.5000 mg | INTRAMUSCULAR | Status: DC | PRN
  Administered 2024-09-17: 0.5 mg via INTRAVENOUS
  Filled 2024-09-17: qty 1

## 2024-09-17 MED ORDER — PANTOPRAZOLE SODIUM 20 MG PO TBEC: 20.0000 mg | DELAYED_RELEASE_TABLET | Freq: Every day | ORAL | 1 refills | Status: AC

## 2024-09-17 MED ORDER — IOHEXOL 300 MG/ML  SOLN
100.0000 mL | Freq: Once | INTRAMUSCULAR | Status: AC | PRN
  Administered 2024-09-17: 100 mL via INTRAVENOUS

## 2024-09-17 MED ORDER — HYOSCYAMINE SULFATE 0.125 MG SL SUBL
0.2500 mg | SUBLINGUAL_TABLET | Freq: Once | SUBLINGUAL | Status: AC
  Administered 2024-09-17: 0.25 mg via SUBLINGUAL
  Filled 2024-09-17: qty 2

## 2024-09-17 MED ORDER — HYOSCYAMINE SULFATE SL 0.125 MG SL SUBL: 1.0000 | SUBLINGUAL_TABLET | Freq: Four times a day (QID) | SUBLINGUAL | 0 refills | Status: AC | PRN

## 2024-09-17 MED ORDER — ONDANSETRON 4 MG PO TBDP: 4.0000 mg | ORAL_TABLET | Freq: Three times a day (TID) | ORAL | 0 refills | Status: AC | PRN

## 2024-09-17 MED ORDER — PANTOPRAZOLE SODIUM 40 MG IV SOLR
40.0000 mg | Freq: Once | INTRAVENOUS | Status: AC
  Administered 2024-09-17: 40 mg via INTRAVENOUS
  Filled 2024-09-17: qty 10

## 2024-09-17 MED ORDER — ALUM & MAG HYDROXIDE-SIMETH 200-200-20 MG/5ML PO SUSP
30.0000 mL | Freq: Once | ORAL | Status: AC
  Administered 2024-09-17: 30 mL via ORAL
  Filled 2024-09-17: qty 30

## 2024-09-17 MED ORDER — SODIUM CHLORIDE 0.9 % IV BOLUS
1000.0000 mL | Freq: Once | INTRAVENOUS | Status: AC
  Administered 2024-09-17: 1000 mL via INTRAVENOUS

## 2024-09-17 MED ORDER — LIDOCAINE VISCOUS HCL 2 % MT SOLN
15.0000 mL | Freq: Once | OROMUCOSAL | Status: AC
  Administered 2024-09-17: 15 mL via ORAL
  Filled 2024-09-17: qty 15

## 2024-09-17 MED ORDER — METOCLOPRAMIDE HCL 5 MG/ML IJ SOLN
10.0000 mg | Freq: Once | INTRAMUSCULAR | Status: AC
  Administered 2024-09-17: 10 mg via INTRAVENOUS
  Filled 2024-09-17: qty 2

## 2024-09-17 NOTE — ED Provider Notes (Signed)
 " [facility] EMERGENCY DEPARTMENT AT [facility] Provider Note  CSN: [id number] Arrival date & time: 09/16/24 2326  Chief Complaint(s) Abdominal Pain  History provided by patient. HPI & MDM Regina Chung is a 54 y.o. female here for.   Abdominal Pain  Upper abdominal pain described as constant aching with intermittent cramping and sharp pains.  Onset was around 9 PM.  Associated with several bouts of nausea and nonbloody nonbilious emesis.  Pain has improved with emesis.  Reported eating Vietnamese spicy Pho for dinner.  No known sick contacts.  No diarrhea.  No other physical complaints     Medical Decision Making Amount and/or Complexity of Data Reviewed Labs: ordered. Decision-making details documented in ED Course. Radiology: ordered and independent interpretation performed. Decision-making details documented in ED Course. ECG/medicine tests: ordered and independent interpretation performed. Decision-making details documented in ED Course.  Risk OTC drugs. Prescription drug management. Parenteral controlled substances. Decision regarding hospitalization.    The patient's presentation involves an extensive number of treatment options, and the complaint(s) carry with it a high risk of complications and morbidity. The differential diagnosis includes but not limited to those listed below:  Upper abdominal pain with nausea and vomiting Initial intervention included IV fluids, IV pain medicine and antiemetics.  Exam with upper abdominal pain mostly in the epigastrium and right upper quadrant without Murphy sign. CBC with mild leukocytosis.  No anemia CMP without significant electrolyte derangements or renal sufficiency.  Mildly elevated LFTs but not overtly concerning for biliary obstruction.  Lipase normal not consistent with pancreatitis. No formal ultrasound at this time of night here at [facility].  CT scan ordered with notable for evidence of gastritis.  No  perforated viscus.  No other signs concerning for acute intra-abdominal inflammatory/infectious process or bowel obstruction.  Gallbladder itself was normal without stones or biliary duct dilatation EKG without acute ischemic changes, dysrhythmias or blocks.  No evidence of pericarditis.  Concern for cardiac etiology low at this time.  On reassessment, patient's pain had improved.  She is able to tolerate p.o.  Feel she is stable for discharge home.   Final Clinical Impression(s) / ED Diagnoses Final diagnoses:  Other acute gastritis without hemorrhage   The patient appears reasonably screened and/or stabilized for discharge and I doubt any other medical condition or other [facility] requiring further screening, evaluation, or treatment in the ED at this time. I have discussed the findings, Dx and Tx plan with the patient/family who expressed understanding and agree(s) with the plan. Discharge instructions discussed at length. The patient/family was given strict return precautions who verbalized understanding of the instructions. No further questions at time of discharge.  Disposition: Discharge  Condition: Good  ED Discharge Orders          Ordered    ondansetron  (ZOFRAN -ODT) 4 MG disintegrating tablet  Every 8 hours PRN        09/17/24 0254    Hyoscyamine  Sulfate SL (LEVSIN /SL) 0.125 MG SUBL  4 times daily PRN        09/17/24 0254    pantoprazole  (PROTONIX ) 20 MG tablet  Daily        09/17/24 0254              Follow Up: [name], [name], PA [address] [place] [address] [phone]  Call  to schedule an appointment for close follow up     Past Medical History Past Medical History:  Diagnosis Date   IDA (iron deficiency anemia)  Menorrhagia with irregular cycle    Thickened endometrium    Uterine fibroid    Wears glasses    Patient Active Problem List   Diagnosis Date Noted   Fibroids 11/27/2022   S/P laparoscopic hysterectomy 11/27/2022    Menorrhagia 11/27/2022   Home Medication(s) Prior to Admission medications  Medication Sig Start Date End Date Taking? Authorizing Provider  Hyoscyamine  Sulfate SL (LEVSIN /SL) 0.125 MG SUBL Place 1 tablet (0.125 mg total) under the tongue 4 (four) times daily as needed. 09/17/24 09/22/24 Yes Regina Whan, [name], MD  ondansetron  (ZOFRAN -ODT) 4 MG disintegrating tablet Take 1 tablet (4 mg total) by mouth every 8 (eight) hours as needed for up to 3 days for nausea or vomiting. 09/17/24 09/20/24 Yes Regina Chung, [name], MD  pantoprazole  (PROTONIX ) 20 MG tablet Take 1 tablet (20 mg total) by mouth daily. 09/17/24 11/16/24 Yes Regina Chung, [name], MD  acetaminophen  (TYLENOL ) 500 MG tablet Take 2 tablets (1,000 mg total) by mouth every 8 (eight) hours as needed for mild pain or moderate pain. 11/27/22   Regina Sawyer, MD  ibuprofen  (ADVIL ) 600 MG tablet Take 1 tablet (600 mg total) by mouth every 6 (six) hours as needed for mild pain or moderate pain. 11/27/22   Regina Sawyer, MD  oxyCODONE  (OXY IR/ROXICODONE ) 5 MG immediate release tablet Take 1-2 tablets (5-10 mg total) by mouth every 4 (four) hours as needed for moderate pain or severe pain. 11/27/22   Regina Sawyer, MD                                                                                                                                    Allergies Patient has no known allergies.  Review of Systems Review of Systems  Gastrointestinal:  Positive for abdominal pain.   As noted in HPI  Physical Exam Vital Signs  I have reviewed the triage vital signs BP 116/67   Pulse 82   Temp 99.3 F (37.4 C)   Resp 18   Ht 5' 2 (1.575 m)   Wt 86.6 kg   LMP 11/14/2022   SpO2 93%   BMI 34.93 kg/m   Physical Exam Vitals reviewed.  Constitutional:      General: She is not in acute distress.    Appearance: She is well-developed. She is not diaphoretic.  HENT:     Head: Normocephalic and atraumatic.     Right Ear: External ear normal.     Left Ear:  External ear normal.     Nose: Nose normal.  Eyes:     General: No scleral icterus.    Conjunctiva/sclera: Conjunctivae normal.  Neck:     Trachea: Phonation normal.  Cardiovascular:     Rate and Rhythm: Normal rate and regular rhythm.  Pulmonary:     Effort: Pulmonary effort is normal. No respiratory distress.     Breath sounds: No stridor.  Abdominal:     General: There  is no distension.     Tenderness: There is abdominal tenderness in the right upper quadrant and epigastric area. Negative signs include Murphy's sign.  Musculoskeletal:        General: Normal range of motion.     Cervical back: Normal range of motion.  Neurological:     Mental Status: She is alert and oriented to person, place, and time.  Psychiatric:        Behavior: Behavior normal.     ED Results and Treatments Labs (all labs ordered are listed, but only abnormal results are displayed) Labs Reviewed  COMPREHENSIVE METABOLIC PANEL WITH GFR - Abnormal; Notable for the following components:      Result Value   Glucose, Bld 101 (*)    AST 125 (*)    ALT 71 (*)    All other components within normal limits  CBC - Abnormal; Notable for the following components:   WBC 11.0 (*)    All other components within normal limits  LIPASE, BLOOD  URINALYSIS, ROUTINE W REFLEX MICROSCOPIC                                                                                                                         EKG  EKG Interpretation Date/Time:  Wednesday September 16 2024 23:43:36 EST Ventricular Rate:  87 PR Interval:  142 QRS Duration:  78 QT Interval:  382 QTC Calculation: 459 R Axis:   -20  Text Interpretation: Normal sinus rhythm Normal ECG No previous ECGs available No old tracing to compare Confirmed by Trine Likes [phone]) on 09/17/2024 12:10:55 AM       Radiology CT ABDOMEN PELVIS W CONTRAST Result Date: 09/17/2024 EXAM: CT ABDOMEN AND PELVIS WITH CONTRAST 09/17/2024 01:29:24 AM TECHNIQUE: CT of the  abdomen and pelvis was performed with the administration of 100 mL of iohexol  (OMNIPAQUE ) 300 MG/ML solution. Multiplanar reformatted images are provided for review. Automated exposure control, iterative reconstruction, and/or weight-based adjustment of the mA/kV was utilized to reduce the radiation dose to as low as reasonably achievable. COMPARISON: None available. CLINICAL HISTORY: Severe upper abdominal pain radiating into the chest, sharp and constant. 4 episodes of vomiting since onset. FINDINGS: LOWER CHEST: Lung bases are clear. There is moderate elevation of the right hemidiaphragm. The cardiac size is normal. LIVER: The liver is unremarkable. GALLBLADDER AND BILE DUCTS: Gallbladder is unremarkable. No biliary ductal dilatation. SPLEEN: No acute abnormality. PANCREAS: There are no pancreatic mass lesions, ductal dilatation, or adjacent inflammation. ADRENAL GLANDS: There is no adrenal mass. KIDNEYS, URETERS AND BLADDER: There is no renal mass, stone, or hydronephrosis. No perinephric or periureteral stranding. Urinary bladder is unremarkable. GI AND BOWEL: The stomach is contracted. There is faint mucosal enhancement, probable thickened folds, likely reflecting gastritis. No penetrating ulcer is seen. There is no small bowel obstruction or inflammation. The appendix is normal. There is mild fecal stasis. No evidence of colitis or diverticulitis. PERITONEUM AND RETROPERITONEUM: There is no free fluid, free hemorrhage, free air,  or incarcerated hernia. No focal inflammatory process is seen. VASCULATURE: Aorta is normal in caliber. LYMPH NODES: No lymphadenopathy. REPRODUCTIVE ORGANS: The uterus is surgically absent. There is no adnexal mass. BONES AND SOFT TISSUES: There are mild degenerative changes of the thoracic and lumbar spine. No acute or significant osseous findings. There is a small umbilical fat hernia. Scattered pelvic phleboliths. No focal soft tissue abnormality. IMPRESSION: 1. Suspected  gastritis, without penetrating ulcer. 2. No acute CT findings in the abdomen or pelvis. 3. Umbilical fat hernia. Electronically signed by: Francis Quam MD 09/17/2024 01:51 AM EST RP Workstation: [id number]    Medications Ordered in ED Medications  ondansetron  (ZOFRAN ) injection 4 mg (4 mg Intravenous Given 09/16/24 2348)  metoCLOPramide  (REGLAN ) injection 10 mg (10 mg Intravenous Given 09/17/24 0101)  sodium chloride  0.9 % bolus 1,000 mL (0 mLs Intravenous Stopped 09/17/24 0147)  iohexol  (OMNIPAQUE ) 300 MG/ML solution 100 mL (100 mLs Intravenous Contrast Given 09/17/24 0110)  pantoprazole  (PROTONIX ) injection 40 mg (40 mg Intravenous Given 09/17/24 0218)  alum & mag hydroxide-simeth (MAALOX/MYLANTA) 200-200-20 MG/5ML suspension 30 mL (30 mLs Oral Given 09/17/24 0219)    And  lidocaine  (XYLOCAINE ) 2 % viscous mouth solution 15 mL (15 mLs Oral Given 09/17/24 0219)  hyoscyamine  (LEVSIN  SL) SL tablet 0.25 mg (0.25 mg Sublingual Given 09/17/24 0219)   Procedures Procedures  (including critical care time)   This chart was dictated using voice recognition software.  Despite best efforts to proofread,  errors can occur which can change the documentation meaning.   Trine [name], MD 09/17/24 [phone]  "
